# Patient Record
Sex: Male | Born: 1970 | Race: White | Hispanic: No | Marital: Married | Smoking: Never smoker
Health system: Southern US, Community
[De-identification: ages and names within clinical notes are randomized; demographics above are authoritative.]

## PROBLEM LIST (undated history)

## (undated) DIAGNOSIS — E785 Hyperlipidemia, unspecified: Secondary | ICD-10-CM

## (undated) DIAGNOSIS — F419 Anxiety disorder, unspecified: Secondary | ICD-10-CM

## (undated) HISTORY — PX: WISDOM TOOTH EXTRACTION: SHX21

## (undated) HISTORY — DX: Hyperlipidemia, unspecified: E78.5

## (undated) HISTORY — PX: HERNIA REPAIR: SHX51

## (undated) HISTORY — DX: Anxiety disorder, unspecified: F41.9

---

## 2005-10-20 ENCOUNTER — Emergency Department (HOSPITAL_COMMUNITY): Admission: EM | Admit: 2005-10-20 | Discharge: 2005-10-20 | Payer: Self-pay | Admitting: Emergency Medicine

## 2006-09-22 ENCOUNTER — Ambulatory Visit: Payer: Self-pay

## 2007-08-29 IMAGING — US ABDOMEN ULTRASOUND
1 series · 17 of 25 positions shown · non-contrast
Comparison: none

REASON FOR EXAM: Abdominal Pain
COMMENTS:

[Series 1: abdomen ultrasound · 17 of 57 slices shown]
[im 1/57]
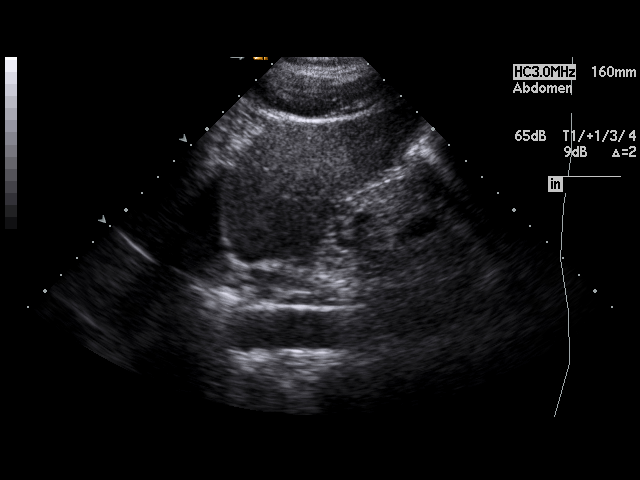
[im 5/57]
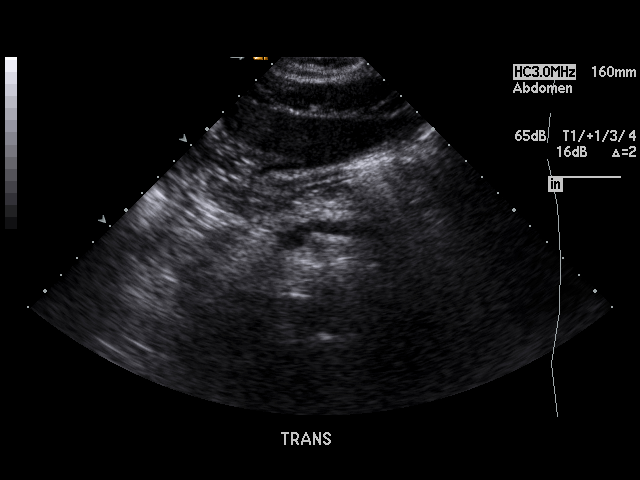
[im 8/57]
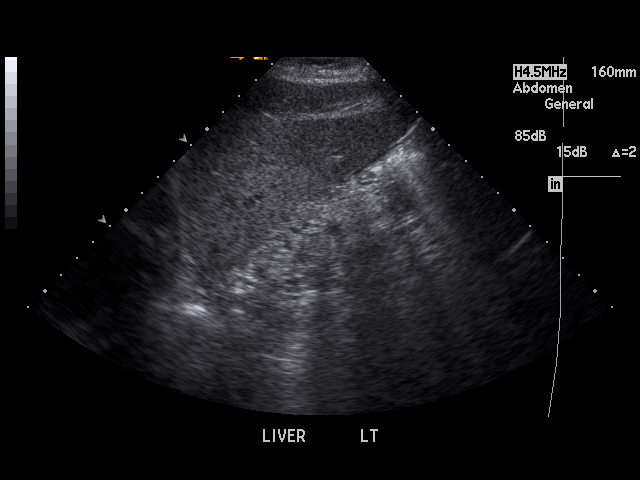
[im 12/57]
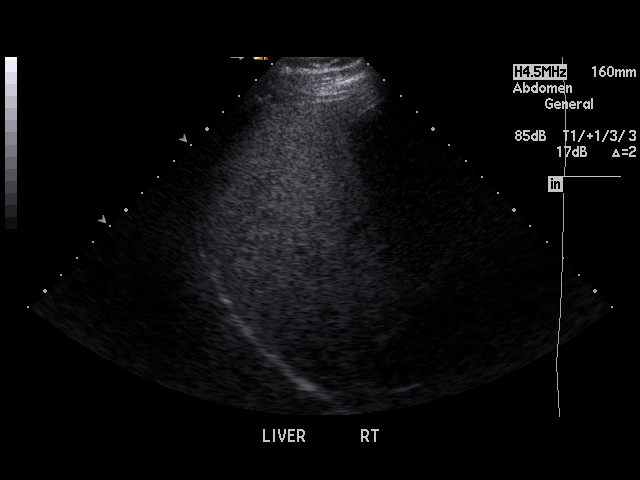
[im 15/57]
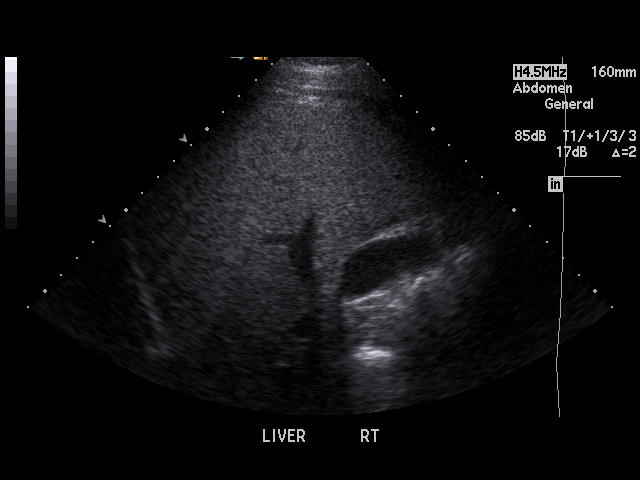
[im 19/57]
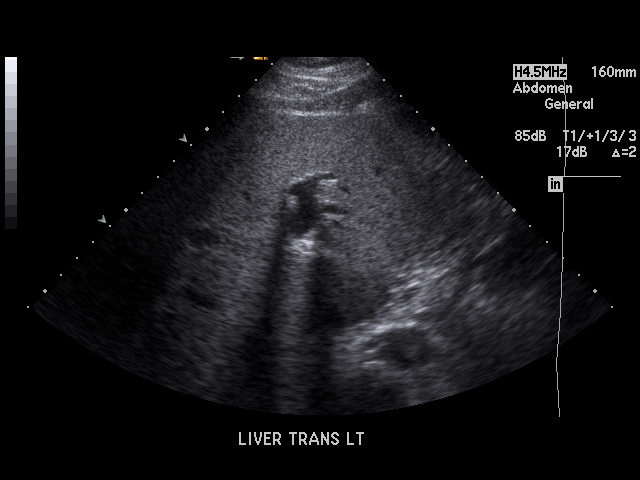
[im 22/57]
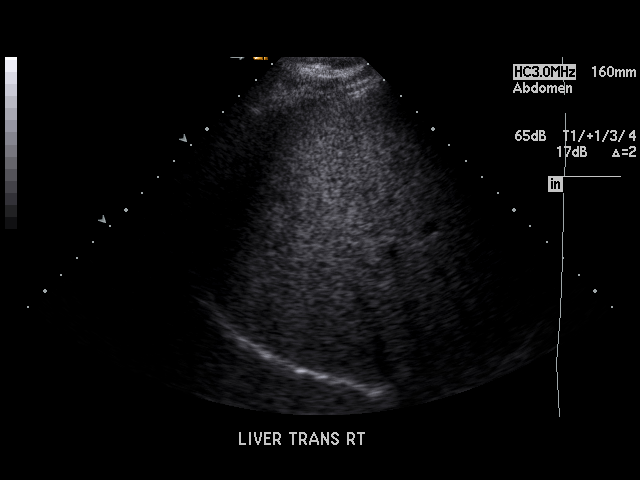
[im 26/57]
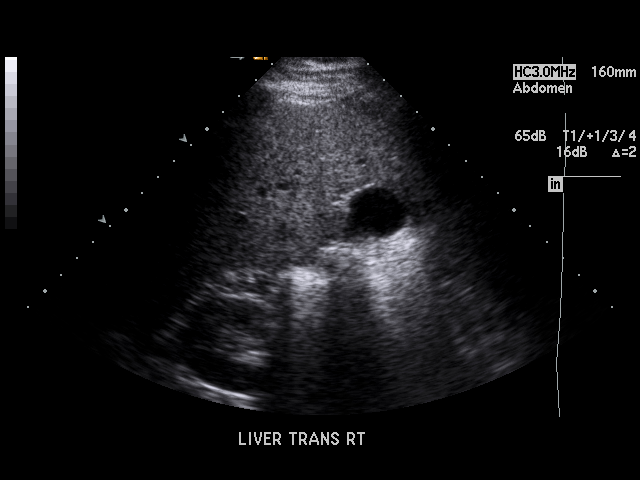
[im 29/57]
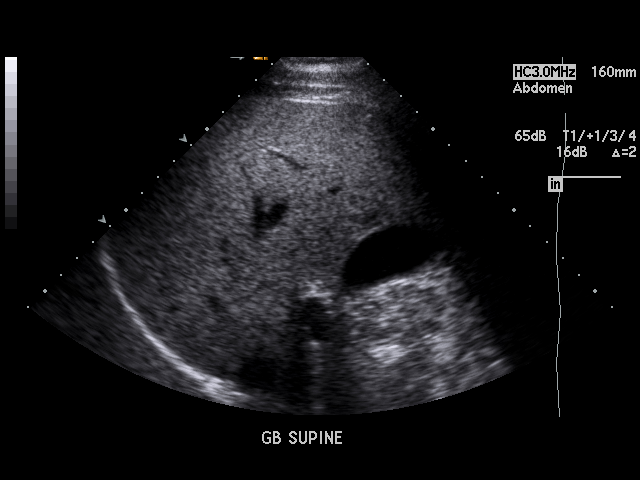
[im 31/57]
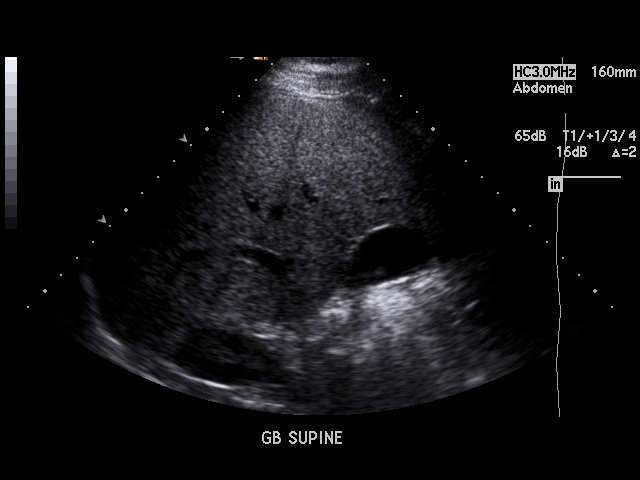
[im 36/57]
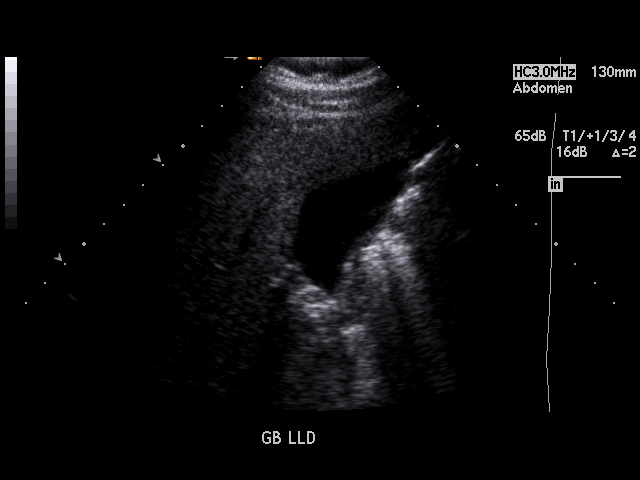
[im 38/57]
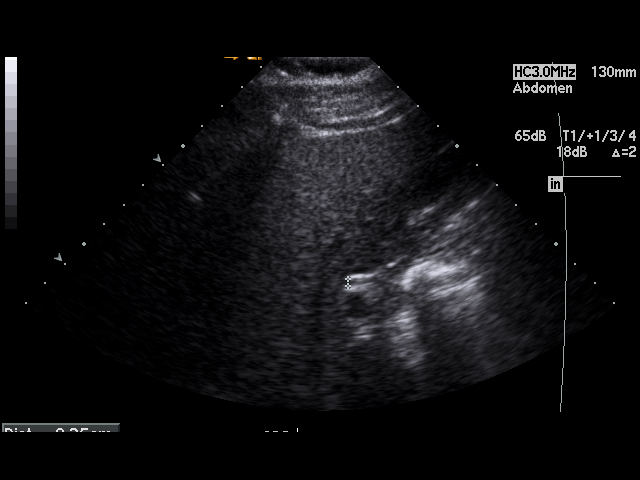
[im 43/57]
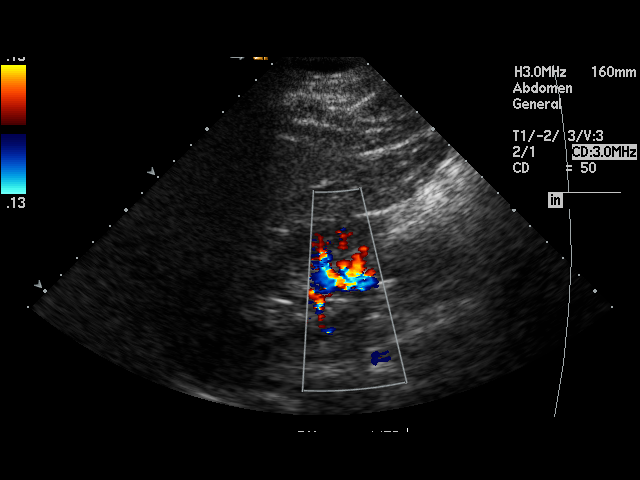
[im 45/57]
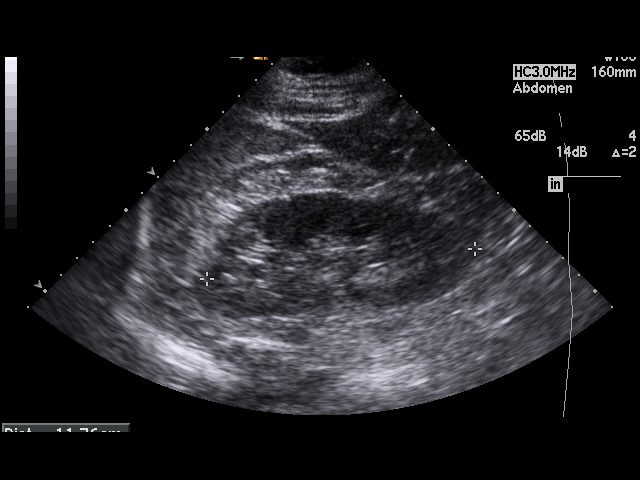
[im 50/57]
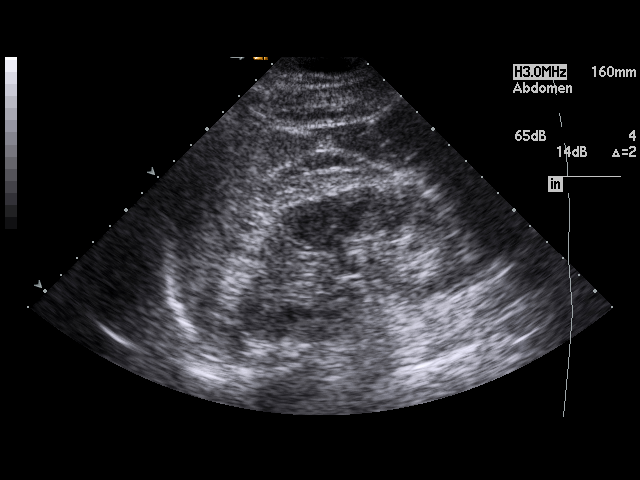
[im 52/57]
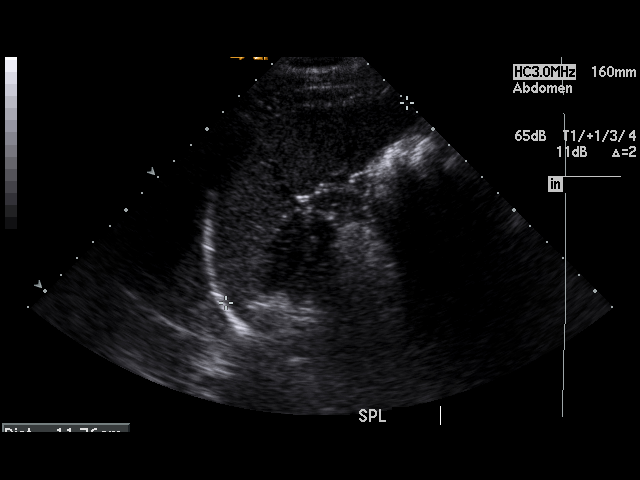
[im 57/57]
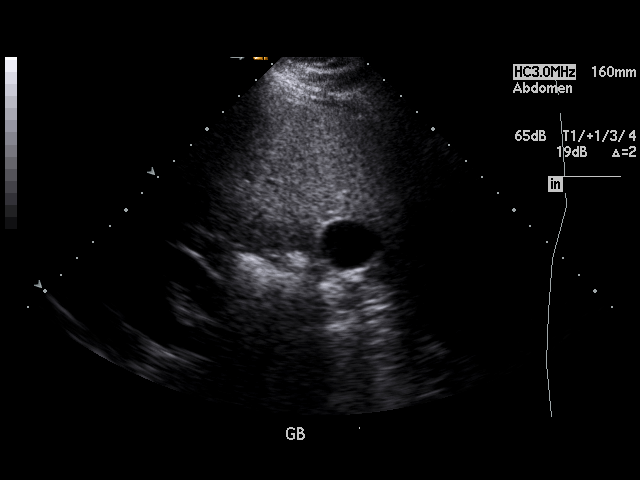

[17 of 25 positions shown; findings below may reference images not displayed]

PROCEDURE:     US  - US ABDOMEN GENERAL SURVEY  - September 22, 2006  [DATE]

RESULT:     The liver exhibits normal echotexture with no evidence of a mass
nor of ductal dilation. Portal venous flow is normal in direction toward the
liver. The pancreas is normal in contour and echotexture. There is no
evidence of ductal dilation within the pancreas or liver. The gallbladder is
adequately distended with no evidence of stones, wall thickening, or
pericholecystic fluid. The common bile duct is normal measuring 2.5 mm in
diameter. The spleen, abdominal aorta, and kidneys are normal in appearance.
There is no evidence of ascites.
IMPRESSION: 1.Normal abdominal ultrasound examination.

## 2008-11-28 ENCOUNTER — Ambulatory Visit: Payer: Self-pay | Admitting: Family Medicine

## 2009-11-04 IMAGING — CR DG KNEE 1-2V*R*
1 series · 2 of 2 positions shown · non-contrast
Comparison: none

REASON FOR EXAM: Right Knee Pain
COMMENTS:

PROCEDURE:     KDR - KDXR KNEE RIGHT AP AND LATERAL  - November 28, 2008  [DATE]
RESULT:     Images of the right knee demonstrate no fracture, dislocation or
radiopaque foreign body. Joint effusion may be present.

[Series 2: view not recorded · 0.17mm/px · 2 of 2 slices shown]
[im 1/2]
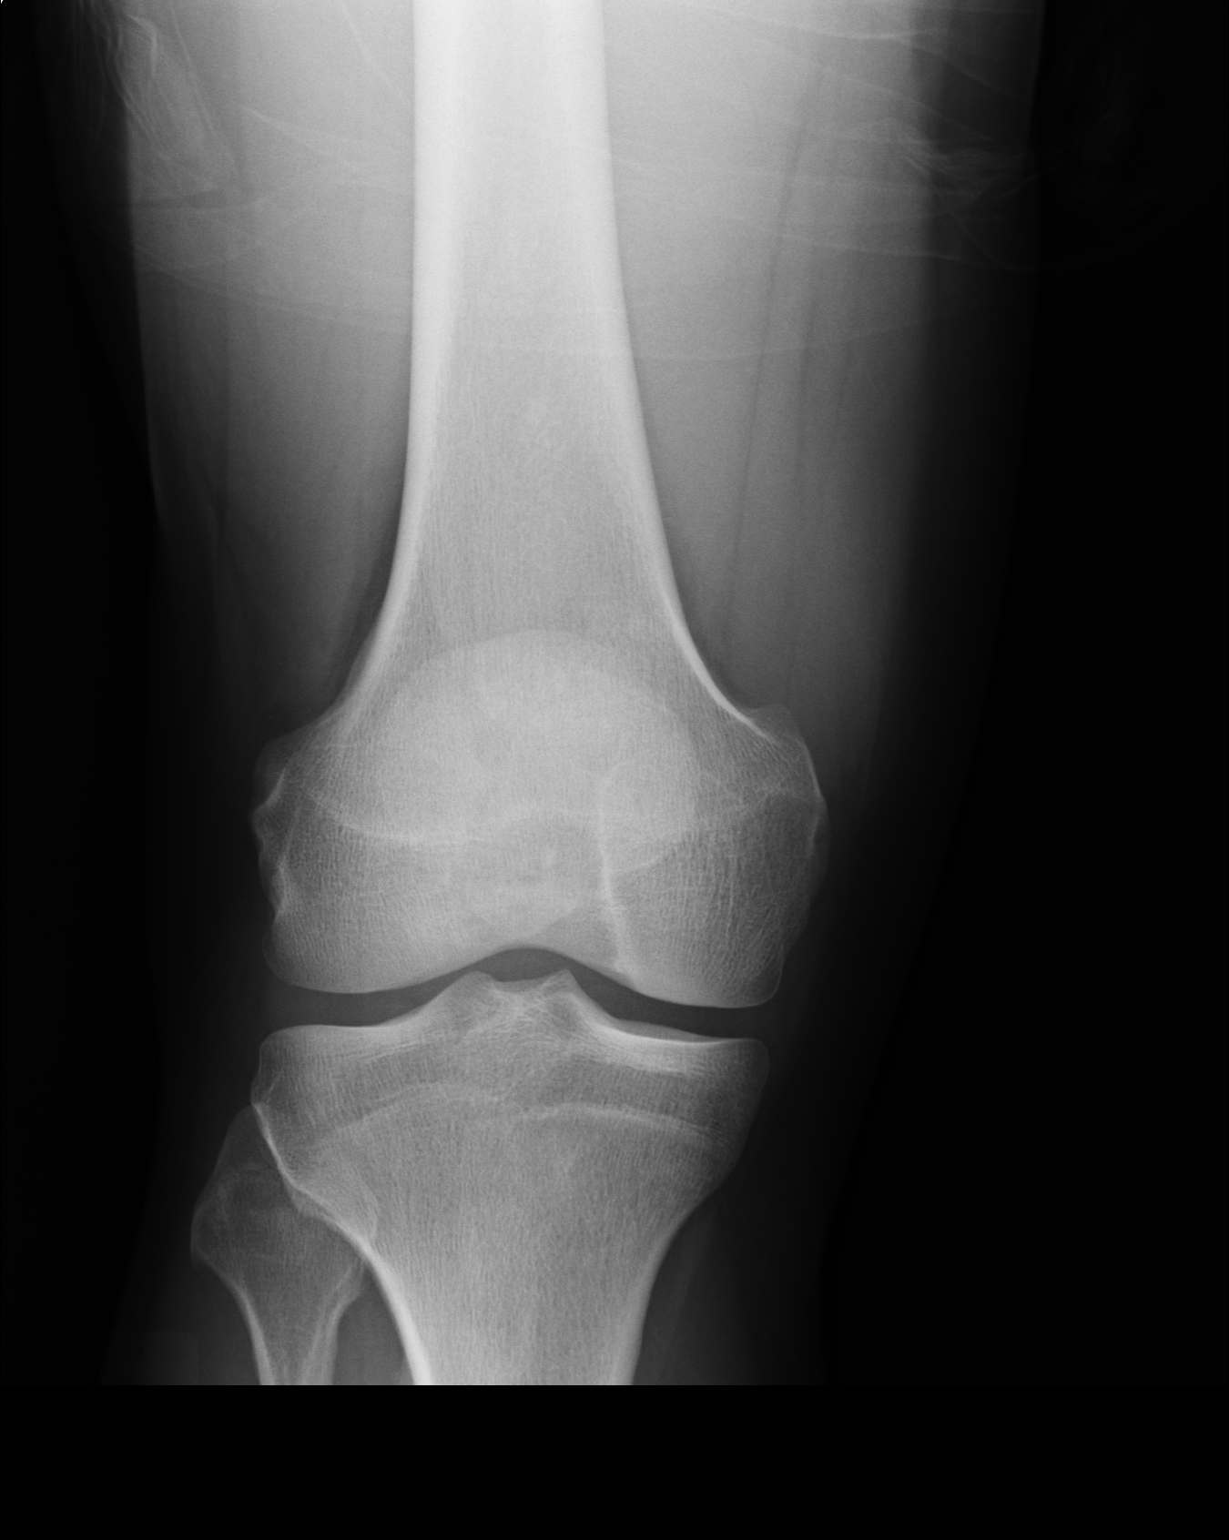
[im 2/2]
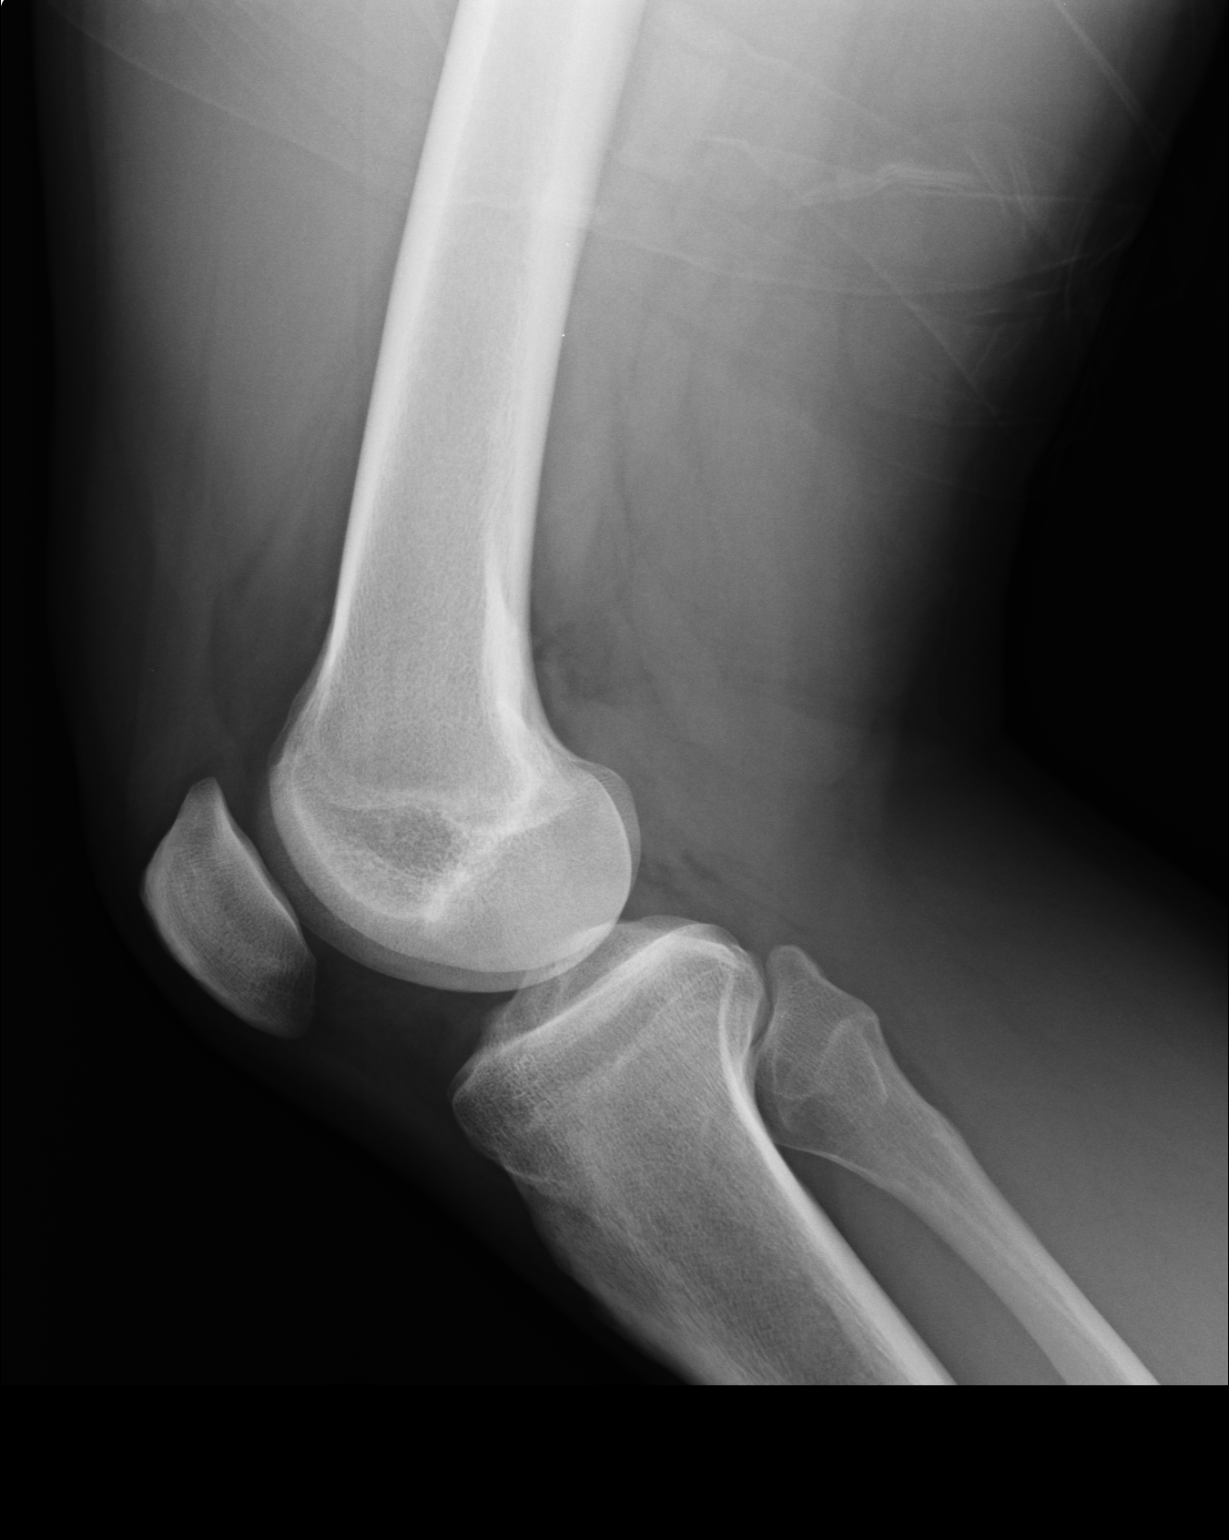

[2 of 2 positions shown; findings below may reference images not displayed]

IMPRESSION: No acute bony abnormality of the right knee demonstrated.

## 2013-02-25 LAB — BASIC METABOLIC PANEL
BUN: 15 mg/dL (ref 4–21)
Creatinine: 1.2 mg/dL (ref 0.6–1.3)
GLUCOSE: 85 mg/dL
POTASSIUM: 4.5 mmol/L (ref 3.4–5.3)
SODIUM: 141 mmol/L (ref 137–147)

## 2013-02-25 LAB — HEPATIC FUNCTION PANEL
ALT: 27 U/L (ref 10–40)
AST: 27 U/L (ref 14–40)
Alkaline Phosphatase: 72 U/L (ref 25–125)
Bilirubin, Total: 0.5 mg/dL

## 2013-02-25 LAB — LIPID PANEL
CHOLESTEROL: 190 mg/dL (ref 0–200)
HDL: 41 mg/dL (ref 35–70)
LDL CALC: 120 mg/dL
Triglycerides: 146 mg/dL (ref 40–160)

## 2016-02-01 DIAGNOSIS — K219 Gastro-esophageal reflux disease without esophagitis: Secondary | ICD-10-CM | POA: Insufficient documentation

## 2016-02-01 DIAGNOSIS — Z8619 Personal history of other infectious and parasitic diseases: Secondary | ICD-10-CM | POA: Insufficient documentation

## 2016-02-16 ENCOUNTER — Ambulatory Visit (INDEPENDENT_AMBULATORY_CARE_PROVIDER_SITE_OTHER): Payer: BLUE CROSS/BLUE SHIELD | Admitting: Family Medicine

## 2016-02-16 ENCOUNTER — Encounter: Payer: Self-pay | Admitting: Family Medicine

## 2016-02-16 VITALS — BP 130/82 | HR 66 | Temp 97.6°F | Resp 16 | Ht 68.0 in | Wt 176.2 lb

## 2016-02-16 DIAGNOSIS — Z0000 Encounter for general adult medical examination without abnormal findings: Secondary | ICD-10-CM | POA: Diagnosis not present

## 2016-02-16 NOTE — Progress Notes (Signed)
Subjective:     Patient ID: Samuel Tyler, male   DOB: 11/26/1970, 45 y.o.   MRN: [id number]  HPI  Chief Complaint  Patient presents with  . Annual Exam    Patient comes in office today for his annual physical, he states that he has no questions or concerns today. Patient reports that he is eating healthy and exercising 3x a week, patient is averaging between 7-8hrs of sleep at night. Last recorded Tdao was 02/25/13.   States he continues to work for [employer] in their [employer]. Reports he resumed running 2 miles every other day.   Review of Systems General: Feeling well HEENT: regular dental visits and recent eye exam. Wears glasses at work for viewing the computer screen. Cardiovascular: no chest pain, shortness of breath, or palpitations GI: no heartburn, no change in bowel habits or blood in the stool GU: nocturia x 0, no change in bladder habits  Psychiatric: not depressed Musculoskeletal: no joint pain; had pyriformis injury managed and resolved via his chiropractor Skin: Reports recent dermatology screen    Objective:   Physical Exam  Constitutional: He appears well-developed and well-nourished. No distress.  Eyes: PERRLA, Neck: no thyromegaly, tenderness or nodules, no cervical adenopathy ENT: TM's intact without inflammation; No tonsillar enlargement or exudate, Lungs: Clear Heart : RRR without murmur or gallop Abd: bowel sounds present, soft, non-tender, no organomegaly Extremities: no edema      Assessment:    1. Annual physical exam - Comprehensive metabolic panel - Lipid panel    Plan:    Further f/u pending lab results.

## 2016-02-16 NOTE — Patient Instructions (Signed)
We will call you with the lab results. 

## 2016-02-17 ENCOUNTER — Telehealth: Payer: Self-pay

## 2016-02-17 LAB — COMPREHENSIVE METABOLIC PANEL
ALBUMIN: 4.7 g/dL (ref 3.5–5.5)
ALK PHOS: 62 IU/L (ref 39–117)
ALT: 24 IU/L (ref 0–44)
AST: 22 IU/L (ref 0–40)
Albumin/Globulin Ratio: 1.5 (ref 1.2–2.2)
BUN / CREAT RATIO: 11 (ref 9–20)
BUN: 13 mg/dL (ref 6–24)
Bilirubin Total: 0.6 mg/dL (ref 0.0–1.2)
CO2: 27 mmol/L (ref 18–29)
CREATININE: 1.2 mg/dL (ref 0.76–1.27)
Calcium: 10.1 mg/dL (ref 8.7–10.2)
Chloride: 100 mmol/L (ref 96–106)
GFR calc Af Amer: 85 mL/min/{1.73_m2} (ref >59)
GFR calc non Af Amer: 73 mL/min/{1.73_m2} (ref >59)
GLUCOSE: 86 mg/dL (ref 65–99)
Globulin, Total: 3.2 g/dL (ref 1.5–4.5)
Potassium: 4.1 mmol/L (ref 3.5–5.2)
Sodium: 142 mmol/L (ref 134–144)
Total Protein: 7.9 g/dL (ref 6.0–8.5)

## 2016-02-17 LAB — LIPID PANEL
CHOLESTEROL TOTAL: 233 mg/dL — AB (ref 100–199)
Chol/HDL Ratio: 6.5 ratio units — ABNORMAL HIGH (ref 0.0–5.0)
HDL: 36 mg/dL — ABNORMAL LOW (ref >39)
LDL CALC: 149 mg/dL — AB (ref 0–99)
TRIGLYCERIDES: 242 mg/dL — AB (ref 0–149)
VLDL CHOLESTEROL CAL: 48 mg/dL — AB (ref 5–40)

## 2016-02-17 NOTE — Telephone Encounter (Signed)
Patient has been advised of lab report. KW 

## 2016-02-17 NOTE — Telephone Encounter (Signed)
-----   Message from Carmon Ginsberg, [place] sent at 02/17/2016  7:57 AM EDT ----- Your cholesterol has increased but your 10 year risk for developing cardiovascular disease remains low at 3.7%. Would encourage you to continue getting back into your running program. Would recheck your labs in one year.

## 2018-09-16 DIAGNOSIS — J101 Influenza due to other identified influenza virus with other respiratory manifestations: Secondary | ICD-10-CM | POA: Diagnosis not present

## 2019-02-15 ENCOUNTER — Other Ambulatory Visit: Payer: Self-pay

## 2019-02-15 DIAGNOSIS — R6889 Other general symptoms and signs: Secondary | ICD-10-CM | POA: Diagnosis not present

## 2019-02-15 DIAGNOSIS — Z20822 Contact with and (suspected) exposure to covid-19: Secondary | ICD-10-CM

## 2019-02-17 LAB — NOVEL CORONAVIRUS, NAA: SARS-CoV-2, NAA: NOT DETECTED

## 2019-06-19 ENCOUNTER — Telehealth: Payer: Self-pay

## 2019-06-19 NOTE — Telephone Encounter (Signed)
LMTCB, okay to schedule for a new patient 40 minute appointment slot.

## 2019-06-19 NOTE — Telephone Encounter (Signed)
Yes this is fine. I see his wife already.

## 2019-06-19 NOTE — Telephone Encounter (Signed)
Copied from [place] [phone]. Topic: Appointment Scheduling - New Patient >> Jun 18, 2019  4:24 PM Oneta Rack wrote: Patient would like to establish with Fenton Malling, PA. Spouse would like a follow up call today if possible (provider schedule is booking out out and not allowing me to schedule)

## 2019-06-28 NOTE — Progress Notes (Signed)
Patient: Samuel Mcburnie., Male    DOB: 1971-03-11, 48 y.o.   MRN: [id number] Visit Date: 07/01/2019  Today's Provider: Mar Daring, PA-C   Chief Complaint  Patient presents with  . Establish Care    with anew provider in office   Subjective:     Establishing care/Annual physical exam Samuel Tyler. is a 48 y.o. male who presents today for health maintenance and complete physical. He feels well. He reports exercising none. He reports he is sleeping well. -----------------------------------------------------------------   Review of Systems  Constitutional: Negative for fatigue and fever.  HENT: Negative.   Eyes: Negative for visual disturbance.  Respiratory: Negative for chest tightness, shortness of breath and wheezing.   Cardiovascular: Negative for chest pain, palpitations and leg swelling.  Gastrointestinal: Negative.   Endocrine: Negative.   Genitourinary: Negative.   Musculoskeletal: Negative.   Skin: Negative.   Allergic/Immunologic: Negative.   Neurological: Negative for weakness, light-headedness and headaches.  Hematological: Negative.   Psychiatric/Behavioral: Negative.     Social History      He  reports that he is a non-smoker but has been exposed to tobacco smoke. He has never used smokeless tobacco. He reports current alcohol use. He reports that he does not use drugs.       Social History   Socioeconomic History  . Marital status: Married    Spouse name: Not on file  . Number of children: Not on file  . Years of education: Not on file  . Highest education level: Not on file  Occupational History  . Not on file  Tobacco Use  . Smoking status: Passive Smoke Exposure - Never Smoker  . Smokeless tobacco: Never Used  Substance and Sexual Activity  . Alcohol use: Yes  . Drug use: No  . Sexual activity: Yes    Partners: Female    Birth control/protection: None  Other Topics Concern  . Not on file  Social History Narrative  .  Not on file   Social Determinants of Health   Financial Resource Strain:   . Difficulty of Paying Living Expenses: Not on file  Food Insecurity:   . Worried About Charity fundraiser in the Last Year: Not on file  . Ran Out of Food in the Last Year: Not on file  Transportation Needs:   . Lack of Transportation (Medical): Not on file  . Lack of Transportation (Non-Medical): Not on file  Physical Activity:   . Days of Exercise per Week: Not on file  . Minutes of Exercise per Session: Not on file  Stress:   . Feeling of Stress : Not on file  Social Connections:   . Frequency of Communication with Friends and Family: Not on file  . Frequency of Social Gatherings with Friends and Family: Not on file  . Attends Religious Services: Not on file  . Active Member of Clubs or Organizations: Not on file  . Attends Archivist Meetings: Not on file  . Marital Status: Not on file    History reviewed. No pertinent past medical history.   Patient Active Problem List   Diagnosis Date Noted  . History of chicken pox 02/01/2016    Past Surgical History:  Procedure Laterality Date  . HERNIA REPAIR    . WISDOM TOOTH EXTRACTION      Family History        Family Status  Relation Name Status  . Mother  Alive  .  Sister  Alive  . Brother  Alive  . Daughter  Alive  . Son  Alive  . Brother  Alive  . Daughter  Alive        His family history includes Hypertension in his mother.      No Known Allergies  No current outpatient medications on file.   Patient Care Team: Mar Daring, PA-C as PCP - General (Family Medicine)    Objective:    Vitals: BP 140/88 (BP Location: Left Arm, Patient Position: Sitting, Cuff Size: Large)   Pulse 78   Temp 97.6 F (36.4 C) (Temporal)   Resp 16   Ht 5\' 7"  (1.702 m)   Wt 194 lb (88 kg)   BMI 30.38 kg/m    Vitals:   07/01/19 0812  BP: 140/88  Pulse: 78  Resp: 16  Temp: 97.6 F (36.4 C)  TempSrc: Temporal  Weight: 194  lb (88 kg)  Height: 5\' 7"  (1.702 m)     Physical Exam Constitutional:      General: He is not in acute distress.    Appearance: Normal appearance. He is well-developed. He is obese. He is not ill-appearing.  HENT:     Head: Normocephalic and atraumatic.     Right Ear: Tympanic membrane, ear canal and external ear normal.     Left Ear: Tympanic membrane, ear canal and external ear normal.  Eyes:     General: No scleral icterus.       Right eye: No discharge.        Left eye: No discharge.     Extraocular Movements: Extraocular movements intact.     Conjunctiva/sclera: Conjunctivae normal.     Pupils: Pupils are equal, round, and reactive to light.  Neck:     Thyroid: No thyromegaly.     Trachea: No tracheal deviation.  Cardiovascular:     Rate and Rhythm: Normal rate and regular rhythm.     Pulses: Normal pulses.     Heart sounds: Normal heart sounds. No murmur.  Pulmonary:     Effort: Pulmonary effort is normal. No respiratory distress.     Breath sounds: Normal breath sounds. No wheezing or rales.  Chest:     Chest wall: No tenderness.  Abdominal:     General: Abdomen is flat. Bowel sounds are normal. There is no distension.     Palpations: Abdomen is soft. There is no mass.     Tenderness: There is no abdominal tenderness. There is no guarding or rebound.     Hernia: No hernia is present.  Musculoskeletal:        General: No tenderness. Normal range of motion.     Cervical back: Normal range of motion and neck supple.     Right lower leg: No edema.     Left lower leg: No edema.  Lymphadenopathy:     Cervical: No cervical adenopathy.  Skin:    General: Skin is warm and dry.     Capillary Refill: Capillary refill takes less than 2 seconds.     Findings: No erythema or rash.  Neurological:     General: No focal deficit present.     Mental Status: He is alert and oriented to person, place, and time. Mental status is at baseline.     Cranial Nerves: No cranial nerve  deficit.     Motor: No abnormal muscle tone.     Coordination: Coordination normal.     Deep Tendon Reflexes: Reflexes are normal  and symmetric. Reflexes normal.  Psychiatric:        Mood and Affect: Mood normal.        Behavior: Behavior normal.        Thought Content: Thought content normal.        Judgment: Judgment normal.      Depression Screen PHQ 2/9 Scores 07/01/2019 02/16/2016  PHQ - 2 Score 0 0       Assessment & Plan:     Routine Health Maintenance and Physical Exam  Exercise Activities and Dietary recommendations Goals   None     Immunization History  Administered Date(s) Administered  . Influenza Inj Mdck Quad Pf 05/03/2017  . Influenza, Seasonal, Injecte, Preservative Fre 05/04/2016  . Influenza,inj,Quad PF,6+ Mos 04/26/2018  . Influenza,inj,quad, With Preservative 04/28/2015  . Influenza-Unspecified 04/29/2014  . Tdap 02/25/2013    Health Maintenance  Topic Date Due  . HIV Screening  10/27/1986  . INFLUENZA VACCINE  10/16/2019 (Originally 02/16/2019)  . TETANUS/TDAP  02/26/2023     Discussed health benefits of physical activity, and encouraged him to engage in regular exercise appropriate for his age and condition.    1. Annual physical exam Normal physical exam today. Will check labs as below and f/u pending lab results. If labs are stable and WNL he will not need to have these rechecked for one year at his next annual physical exam. He is to call the office in the meantime if he has any acute issue, questions or concerns. - CBC w/Diff - Comprehensive Metabolic Panel (CMET) - HgB A1c  2. Colon cancer screening Deferred at this time.   3. Prostate cancer screening Will check labs as below and f/u pending results. - PSA  4. Class 1 obesity due to excess calories with serious comorbidity and body mass index (BMI) of 30.0 to 30.9 in adult Counseled patient on healthy lifestyle modifications including dieting and exercise.   5. Screening for  diabetes mellitus Will check labs as below and f/u pending results. - HgB A1c  6. Encounter for lipid screening for cardiovascular disease Will check labs as below and f/u pending results. - Lipid Profile  7. Thyroid disorder screen Will check labs as below and f/u pending results. - TSH  8. Screening for HIV without presence of risk factors Will check labs as below and f/u pending results. - HIV antibody (with reflex)  --------------------------------------------------------------------    Mar Daring, PA-C  [facility]

## 2019-07-01 ENCOUNTER — Encounter: Payer: Self-pay | Admitting: Physician Assistant

## 2019-07-01 ENCOUNTER — Ambulatory Visit (INDEPENDENT_AMBULATORY_CARE_PROVIDER_SITE_OTHER): Payer: BC Managed Care – PPO | Admitting: Physician Assistant

## 2019-07-01 VITALS — BP 140/88 | HR 78 | Temp 97.6°F | Resp 16 | Ht 67.0 in | Wt 194.0 lb

## 2019-07-01 DIAGNOSIS — Z1329 Encounter for screening for other suspected endocrine disorder: Secondary | ICD-10-CM

## 2019-07-01 DIAGNOSIS — Z6830 Body mass index (BMI) 30.0-30.9, adult: Secondary | ICD-10-CM

## 2019-07-01 DIAGNOSIS — E6609 Other obesity due to excess calories: Secondary | ICD-10-CM

## 2019-07-01 DIAGNOSIS — Z125 Encounter for screening for malignant neoplasm of prostate: Secondary | ICD-10-CM

## 2019-07-01 DIAGNOSIS — Z0000 Encounter for general adult medical examination without abnormal findings: Secondary | ICD-10-CM

## 2019-07-01 DIAGNOSIS — Z131 Encounter for screening for diabetes mellitus: Secondary | ICD-10-CM | POA: Diagnosis not present

## 2019-07-01 DIAGNOSIS — Z1211 Encounter for screening for malignant neoplasm of colon: Secondary | ICD-10-CM | POA: Diagnosis not present

## 2019-07-01 DIAGNOSIS — Z13220 Encounter for screening for lipoid disorders: Secondary | ICD-10-CM | POA: Diagnosis not present

## 2019-07-01 DIAGNOSIS — Z136 Encounter for screening for cardiovascular disorders: Secondary | ICD-10-CM

## 2019-07-01 DIAGNOSIS — Z114 Encounter for screening for human immunodeficiency virus [HIV]: Secondary | ICD-10-CM

## 2019-07-01 NOTE — Patient Instructions (Signed)

## 2019-07-02 LAB — CBC WITH DIFFERENTIAL/PLATELET
Basophils Absolute: 0.1 10*3/uL (ref 0.0–0.2)
Basos: 1 %
EOS (ABSOLUTE): 0.2 10*3/uL (ref 0.0–0.4)
Eos: 3 %
Hematocrit: 47.1 % (ref 37.5–51.0)
Hemoglobin: 16.1 g/dL (ref 13.0–17.7)
Immature Grans (Abs): 0 10*3/uL (ref 0.0–0.1)
Immature Granulocytes: 1 %
Lymphocytes Absolute: 2.2 10*3/uL (ref 0.7–3.1)
Lymphs: 35 %
MCH: 29.8 pg (ref 26.6–33.0)
MCHC: 34.2 g/dL (ref 31.5–35.7)
MCV: 87 fL (ref 79–97)
Monocytes Absolute: 0.6 10*3/uL (ref 0.1–0.9)
Monocytes: 9 %
Neutrophils Absolute: 3.4 10*3/uL (ref 1.4–7.0)
Neutrophils: 51 %
Platelets: 192 10*3/uL (ref 150–450)
RBC: 5.41 x10E6/uL (ref 4.14–5.80)
RDW: 13.1 % (ref 11.6–15.4)
WBC: 6.4 10*3/uL (ref 3.4–10.8)

## 2019-07-02 LAB — LIPID PANEL
Chol/HDL Ratio: 7.8 ratio — ABNORMAL HIGH (ref 0.0–5.0)
Cholesterol, Total: 234 mg/dL — ABNORMAL HIGH (ref 100–199)
HDL: 30 mg/dL — ABNORMAL LOW (ref >39)
LDL Chol Calc (NIH): 139 mg/dL — ABNORMAL HIGH (ref 0–99)
Triglycerides: 356 mg/dL — ABNORMAL HIGH (ref 0–149)
VLDL Cholesterol Cal: 65 mg/dL — ABNORMAL HIGH (ref 5–40)

## 2019-07-02 LAB — COMPREHENSIVE METABOLIC PANEL
ALT: 31 IU/L (ref 0–44)
AST: 22 IU/L (ref 0–40)
Albumin/Globulin Ratio: 1.8 (ref 1.2–2.2)
Albumin: 4.8 g/dL (ref 4.0–5.0)
Alkaline Phosphatase: 76 IU/L (ref 39–117)
BUN/Creatinine Ratio: 11 (ref 9–20)
BUN: 13 mg/dL (ref 6–24)
Bilirubin Total: 0.4 mg/dL (ref 0.0–1.2)
CO2: 22 mmol/L (ref 20–29)
Calcium: 10 mg/dL (ref 8.7–10.2)
Chloride: 101 mmol/L (ref 96–106)
Creatinine, Ser: 1.18 mg/dL (ref 0.76–1.27)
GFR calc Af Amer: 84 mL/min/{1.73_m2} (ref >59)
GFR calc non Af Amer: 73 mL/min/{1.73_m2} (ref >59)
Globulin, Total: 2.6 g/dL (ref 1.5–4.5)
Glucose: 90 mg/dL (ref 65–99)
Potassium: 4 mmol/L (ref 3.5–5.2)
Sodium: 141 mmol/L (ref 134–144)
Total Protein: 7.4 g/dL (ref 6.0–8.5)

## 2019-07-02 LAB — TSH: TSH: 4.75 u[IU]/mL — ABNORMAL HIGH (ref 0.450–4.500)

## 2019-07-02 LAB — PSA: Prostate Specific Ag, Serum: 2 ng/mL (ref 0.0–4.0)

## 2019-07-02 LAB — HIV ANTIBODY (ROUTINE TESTING W REFLEX): HIV Screen 4th Generation wRfx: NONREACTIVE

## 2019-07-02 LAB — HEMOGLOBIN A1C
Est. average glucose Bld gHb Est-mCnc: 105 mg/dL
Hgb A1c MFr Bld: 5.3 % (ref 4.8–5.6)

## 2019-08-09 ENCOUNTER — Other Ambulatory Visit: Payer: BC Managed Care – PPO

## 2019-08-09 ENCOUNTER — Ambulatory Visit: Payer: BC Managed Care – PPO | Attending: Internal Medicine

## 2019-08-09 DIAGNOSIS — U071 COVID-19: Secondary | ICD-10-CM | POA: Insufficient documentation

## 2019-08-09 DIAGNOSIS — Z20822 Contact with and (suspected) exposure to covid-19: Secondary | ICD-10-CM

## 2019-08-10 LAB — NOVEL CORONAVIRUS, NAA: SARS-CoV-2, NAA: DETECTED — AB

## 2019-08-19 ENCOUNTER — Encounter: Payer: Self-pay | Admitting: Physician Assistant

## 2020-02-06 ENCOUNTER — Encounter: Payer: Self-pay | Admitting: Physician Assistant

## 2020-02-06 ENCOUNTER — Other Ambulatory Visit: Payer: Self-pay

## 2020-02-06 ENCOUNTER — Ambulatory Visit (INDEPENDENT_AMBULATORY_CARE_PROVIDER_SITE_OTHER): Payer: BC Managed Care – PPO | Admitting: Physician Assistant

## 2020-02-06 VITALS — BP 144/91 | HR 80 | Temp 97.3°F | Resp 16 | Wt 197.0 lb

## 2020-02-06 DIAGNOSIS — D171 Benign lipomatous neoplasm of skin and subcutaneous tissue of trunk: Secondary | ICD-10-CM | POA: Diagnosis not present

## 2020-02-06 NOTE — Assessment & Plan Note (Addendum)
Possible Lipoma/cyst. [place] order as below to make sure no concerning features/rule out malignancy. Monitor for any changes in shape, size, consistency, or overlying skin

## 2020-02-06 NOTE — Patient Instructions (Signed)
Lipoma  A lipoma is a noncancerous (benign) tumor that is made up of fat cells. This is a very common type of soft-tissue growth. Lipomas are usually found under the skin (subcutaneous). They may occur in any tissue of the body that contains fat. Common areas for lipomas to appear include the back, arms, shoulders, buttocks, and thighs. Lipomas grow slowly, and they are usually painless. Most lipomas do not cause problems and do not require treatment. What are the causes? The cause of this condition is not known. What increases the risk? You are more likely to develop this condition if:  You are 40-60 years old.  You have a family history of lipomas. What are the signs or symptoms? A lipoma usually appears as a small, round bump under the skin. In most cases, the lump will:  Feel soft or rubbery.  Not cause pain or other symptoms. However, if a lipoma is located in an area where it pushes on nerves, it can become painful or cause other symptoms. How is this diagnosed? A lipoma can usually be diagnosed with a physical exam. You may also have tests to confirm the diagnosis and to rule out other conditions. Tests may include:  Imaging tests, such as a CT scan or an MRI.  Removal of a tissue sample to be looked at under a microscope (biopsy). How is this treated? Treatment for this condition depends on the size of the lipoma and whether it is causing any symptoms.  For small lipomas that are not causing problems, no treatment is needed.  If a lipoma is bigger or it causes problems, surgery may be done to remove the lipoma. Lipomas can also be removed to improve appearance. Most often, the procedure is done after applying a medicine that numbs the area (local anesthetic).  Liposuction may be done to reduce the size of the lipoma before it is removed through surgery, or it may be done to remove the lipoma. Lipomas are removed with this method in order to limit incision size and scarring. A  liposuction tube is inserted through a small incision into the lipoma, and the contents of the lipoma are removed through the tube with suction. Follow these instructions at home:  Watch your lipoma for any changes.  Keep all follow-up visits as told by your health care provider. This is important. Contact a health care provider if:  Your lipoma becomes larger or hard.  Your lipoma becomes painful, red, or increasingly swollen. These could be signs of infection or a more serious condition. Get help right away if:  You develop tingling or numbness in an area near the lipoma. This could indicate that the lipoma is causing nerve damage. Summary  A lipoma is a noncancerous tumor that is made up of fat cells.  Most lipomas do not cause problems and do not require treatment.  If a lipoma is bigger or it causes problems, surgery may be done to remove the lipoma.  Contact a health care provider if your lipoma becomes larger or hard, or if it becomes painful, red, or increasingly swollen. Pain, redness, and swelling could be signs of infection or a more serious condition. This information is not intended to replace advice given to you by your health care provider. Make sure you discuss any questions you have with your health care provider. Document Revised: 02/18/2019 Document Reviewed: 02/18/2019 Elsevier Patient Education  2020 [employer]vier Inc.  

## 2020-02-06 NOTE — Progress Notes (Signed)
I,Joseline E Rosas,acting as a scribe for [employer], PA-C.,have documented all relevant documentation on the behalf of Mar Daring, PA-C,as directed by  Mar Daring, PA-C while in the presence of Mar Daring, [place].  Established patient visit   Patient: Samuel Tyler.   DOB: 1971-07-12   49 y.o. Male  MRN: [id number] Visit Date: 02/06/2020  Today's healthcare provider: Mar Daring, PA-C   Chief Complaint  Patient presents with  . Mass   Subjective    HPI  Patient here with c/o small lump/knot on the right side of his lower back, right under rib cage area. No redness, pain,tenderness, or discharge.   Patient Active Problem List   Diagnosis Date Noted  . 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 History of chicken pox 02/01/2016   History reviewed. No pertinent past medical history.     Medications: No outpatient medications prior to visit.   No facility-administered medications prior to visit.    Review of Systems  Respiratory: Negative for chest tightness, shortness of breath and wheezing.   Cardiovascular: Negative for chest pain and palpitations.    Last CBC Lab Results  Component Value Date   WBC 6.4 07/01/2019   HGB 16.1 07/01/2019   HCT 47.1 07/01/2019   MCV 87 07/01/2019   MCH 29.8 07/01/2019   RDW 13.1 07/01/2019   PLT 192 99/35/7017   Last metabolic panel Lab Results  Component Value Date   GLUCOSE 90 07/01/2019   NA 141 07/01/2019   K 4.0 07/01/2019   CL 101 07/01/2019   CO2 22 07/01/2019   BUN 13 07/01/2019   CREATININE 1.18 07/01/2019   GFRNONAA 73 07/01/2019   GFRAA 84 07/01/2019   CALCIUM 10.0 07/01/2019   PROT 7.4 07/01/2019   ALBUMIN 4.8 07/01/2019   LABGLOB 2.6 07/01/2019   AGRATIO 1.8 07/01/2019   BILITOT 0.4 07/01/2019   ALKPHOS 76 07/01/2019   AST 22 07/01/2019   ALT 31 07/01/2019      Objective    BP (!) 144/91 (BP Location: Left Arm, Patient Position: Sitting, Cuff Size: Large)   Pulse  80   Temp (!) 97.3 F (36.3 C) (Temporal)   Resp 16   Wt 197 lb (89.4 kg)   BMI 30.85 kg/m  BP Readings from Last 3 Encounters:  02/06/20 (!) 144/91  07/01/19 140/88  02/16/16 130/82   Wt Readings from Last 3 Encounters:  02/06/20 197 lb (89.4 kg)  07/01/19 194 lb (88 kg)  02/16/16 176 lb 3.2 oz (79.9 kg)      Physical Exam Vitals reviewed.  Constitutional:      General: He is not in acute distress.    Appearance: Normal appearance. He is obese. He is not ill-appearing.  HENT:     Head: Normocephalic and atraumatic.  Eyes:     General: No scleral icterus. Pulmonary:     Effort: No respiratory distress.  Musculoskeletal:     Cervical back: Normal range of motion and neck supple.     Comments:   small (2cm x 1cm) well circumscribed, slightly mobile, non-tender lesion on right flank   Skin:      Neurological:     Mental Status: He is alert.       No results found for any visits on 02/06/20.  Assessment & Plan     Problem List Items Addressed This Visit      Other   Lipoma of back - Primary  Possible Lipoma/cyst. [place] order as below.        Relevant Orders   [place] CHEST SOFT TISSUE      No follow-ups on file.      Reynolds Bowl, PA-C, have reviewed all documentation for this visit. The documentation on 02/06/20 for the exam, diagnosis, procedures, and orders are all accurate and complete.   Rubye [place]  [facility] [phone] (phone) [phone] (fax)  [place]

## 2020-02-14 ENCOUNTER — Ambulatory Visit: Payer: Self-pay

## 2020-02-25 ENCOUNTER — Other Ambulatory Visit: Payer: Self-pay

## 2020-02-25 ENCOUNTER — Ambulatory Visit
Admission: RE | Admit: 2020-02-25 | Discharge: 2020-02-25 | Disposition: A | Discharge status: Home / Self Care | Payer: BC Managed Care – PPO | Source: Ambulatory Visit | Attending: Physician Assistant | Admitting: Physician Assistant

## 2020-02-25 DIAGNOSIS — D1779 Benign lipomatous neoplasm of other sites: Secondary | ICD-10-CM | POA: Diagnosis not present

## 2020-02-25 DIAGNOSIS — D171 Benign lipomatous neoplasm of skin and subcutaneous tissue of trunk: Secondary | ICD-10-CM | POA: Insufficient documentation

## 2020-02-25 DIAGNOSIS — R1900 Intra-abdominal and pelvic swelling, mass and lump, unspecified site: Secondary | ICD-10-CM | POA: Diagnosis not present

## 2020-06-19 ENCOUNTER — Encounter: Payer: Self-pay | Admitting: Physician Assistant

## 2020-06-19 DIAGNOSIS — Z6830 Body mass index (BMI) 30.0-30.9, adult: Secondary | ICD-10-CM

## 2020-06-19 DIAGNOSIS — Z8616 Personal history of COVID-19: Secondary | ICD-10-CM

## 2020-06-19 DIAGNOSIS — Z125 Encounter for screening for malignant neoplasm of prostate: Secondary | ICD-10-CM

## 2020-06-23 NOTE — Progress Notes (Signed)
Complete physical exam   Patient: Samuel Tyler.   DOB: 1971/02/11   49 y.o. Male  MRN: [id number] Visit Date: 06/24/2020  Today's healthcare provider: Mar Daring, PA-C   Chief Complaint  Patient presents with   Annual Exam   Subjective    Samuel Tyler. is a 49 y.o. male who presents today for a complete physical exam.  He reports consuming a general diet. The patient does not participate in regular exercise at present. He generally feels well. He reports sleeping fairly well. He does not have additional problems to discuss today.  HPI  He received his flu vaccine at [place] last month. Covid vaccines also given at [facility]. Tdap given this year at his employer.  History reviewed. No pertinent past medical history. Past Surgical History:  Procedure Laterality Date   HERNIA REPAIR     WISDOM TOOTH EXTRACTION     Social History   Socioeconomic History   Marital status: Married    Spouse name: Not on file   Number of children: Not on file   Years of education: Not on file   Highest education level: Not on file  Occupational History   Not on file  Tobacco Use   Smoking status: Passive Smoke Exposure - Never Smoker   Smokeless tobacco: Never Used  Substance and Sexual Activity   Alcohol use: Yes   Drug use: No   Sexual activity: Yes    Partners: Female    Birth control/protection: None  Other Topics Concern   Not on file  Social History Narrative   Not on file   Social Determinants of Health   Financial Resource Strain:    Difficulty of Paying Living Expenses: Not on file  Food Insecurity:    Worried About Charity fundraiser in the Last Year: Not on file   [employer] of Food in the Last Year: Not on file  Transportation Needs:    Lack of Transportation (Medical): Not on file   Lack of Transportation (Non-Medical): Not on file  Physical Activity:    Days of Exercise per Week: Not on file   Minutes of  Exercise per Session: Not on file  Stress:    Feeling of Stress : Not on file  Social Connections:    Frequency of Communication with Friends and Family: Not on file   Frequency of Social Gatherings with Friends and Family: Not on file   Attends Religious Services: Not on file   Active Member of Clubs or Organizations: Not on file   Attends Archivist Meetings: Not on file   Marital Status: Not on file  Intimate Partner Violence:    Fear of Current or Ex-Partner: Not on file   Emotionally Abused: Not on file   Physically Abused: Not on file   Sexually Abused: Not on file   Family Status  Relation Name Status   Mother  Alive   Sister  Alive   Brother  Alive   Daughter  Alive   Son  Alive   Brother  Alive   Daughter  Alive   Family History  Problem Relation Age of Onset   Hypertension Mother    No Known Allergies  Patient Care Team: [place], Jeralyn Bennett as PCP - General (Family Medicine)   Medications: No outpatient medications prior to visit.   No facility-administered medications prior to visit.    Review of Systems  Constitutional: Negative.  HENT: Negative.   Eyes: Negative.   Respiratory: Negative.   Cardiovascular: Negative.   Gastrointestinal: Negative.   Endocrine: Positive for heat intolerance.  Genitourinary: Negative.   Musculoskeletal: Negative.   Skin: Negative.   Allergic/Immunologic: Negative.   Neurological: Negative.   Hematological: Negative.   Psychiatric/Behavioral: Negative.        Objective    BP (!) 148/92 (BP Location: Left Arm, Patient Position: Sitting, Cuff Size: Normal)    Pulse 76    Temp 98.4 F (36.9 C) (Oral)    Resp 16    Ht 5\' 7"  (1.702 m)    Wt 193 lb 8 oz (87.8 kg)    BMI 30.31 kg/m     Physical Exam Vitals reviewed.  Constitutional:      General: He is not in acute distress.    Appearance: Normal appearance. He is well-developed. He is obese. He is not ill-appearing.  HENT:      Head: Normocephalic and atraumatic.     Right Ear: Tympanic membrane, ear canal and external ear normal.     Left Ear: Tympanic membrane, ear canal and external ear normal.  Eyes:     General: No scleral icterus.       Right eye: No discharge.        Left eye: No discharge.     Extraocular Movements: Extraocular movements intact.     Conjunctiva/sclera: Conjunctivae normal.     Pupils: Pupils are equal, round, and reactive to light.  Neck:     Thyroid: No thyromegaly.     Trachea: No tracheal deviation.  Cardiovascular:     Rate and Rhythm: Normal rate and regular rhythm.     Pulses: Normal pulses.     Heart sounds: Normal heart sounds. No murmur heard.   Pulmonary:     Effort: Pulmonary effort is normal. No respiratory distress.     Breath sounds: Normal breath sounds. No wheezing or rales.  Chest:     Chest wall: No tenderness.  Abdominal:     General: Abdomen is flat. Bowel sounds are normal. There is no distension.     Palpations: Abdomen is soft. There is no mass.     Tenderness: There is no abdominal tenderness. There is no guarding or rebound.  Musculoskeletal:        General: No tenderness. Normal range of motion.     Cervical back: Normal range of motion and neck supple. No tenderness.     Right lower leg: No edema.     Left lower leg: No edema.  Lymphadenopathy:     Cervical: No cervical adenopathy.  Skin:    General: Skin is warm and dry.     Capillary Refill: Capillary refill takes less than 2 seconds.     Findings: No erythema or rash.  Neurological:     General: No focal deficit present.     Mental Status: He is alert and oriented to person, place, and time. Mental status is at baseline.     Cranial Nerves: No cranial nerve deficit.     Motor: No abnormal muscle tone.     Coordination: Coordination normal.     Deep Tendon Reflexes: Reflexes are normal and symmetric. Reflexes normal.  Psychiatric:        Mood and Affect: Mood normal.        Behavior:  Behavior normal.        Thought Content: Thought content normal.        Judgment: Judgment  normal.     Last depression screening scores PHQ 2/9 Scores 06/24/2020 07/01/2019 02/16/2016  PHQ - 2 Score 0 0 0   Last fall risk screening Fall Risk  06/24/2020  Falls in the past year? 0  Number falls in past yr: 0  Injury with Fall? 0  Risk for fall due to : No Fall Risks  Follow up Falls evaluation completed   Last Audit-C alcohol use screening Alcohol Use Disorder Test (AUDIT) 06/24/2020  1. How often do you have a drink containing alcohol? 3  2. How many drinks containing alcohol do you have on a typical day when you are drinking? 0  3. How often do you have six or more drinks on one occasion? 0  AUDIT-C Score 3  Alcohol Brief Interventions/Follow-up AUDIT Score <7 follow-up not indicated   A score of 3 or more in women, and 4 or more in men indicates increased risk for alcohol abuse, EXCEPT if all of the points are from question 1   No results found for any visits on 06/24/20.  Assessment & Plan    Routine Health Maintenance and Physical Exam  Exercise Activities and Dietary recommendations Goals   None     Immunization History  Administered Date(s) Administered   Influenza Inj Mdck Quad Pf 05/03/2017   Influenza, Seasonal, Injecte, Preservative Fre 05/04/2016   Influenza,inj,Quad PF,6+ Mos 04/26/2018   Influenza,inj,quad, With Preservative 04/28/2015   Influenza-Unspecified 04/29/2014   Tdap 02/25/2013    Health Maintenance  Topic Date Due   Hepatitis C Screening  Never done   COVID-19 Vaccine (1) Never done   INFLUENZA VACCINE  02/16/2020   TETANUS/TDAP  02/26/2023   HIV Screening  Completed    Discussed health benefits of physical activity, and encouraged him to engage in regular exercise appropriate for his age and condition.  1. Annual physical exam Normal physical exam today. Will check labs as below and f/u pending lab results. If labs are stable  and WNL he will not need to have these rechecked for one year at his next annual physical exam. He is to call the office in the meantime if he has any acute issue, questions or concerns.  2. Encounter for hepatitis C screening test for low risk patient Will check labs as below and f/u pending results. - Hepatitis C Antibody  3. Class 1 obesity due to excess calories with serious comorbidity and body mass index (BMI) of 30.0 to 30.9 in adult Counseled patient on healthy lifestyle modifications including dieting and exercise.     No follow-ups on file.     Reynolds Bowl, PA-C, have reviewed all documentation for this visit. The documentation on 06/24/20 for the exam, diagnosis, procedures, and orders are all accurate and complete.   [place]ye Beach  [facility] [phone] (phone) [phone] (fax)  [place]

## 2020-06-24 ENCOUNTER — Ambulatory Visit (INDEPENDENT_AMBULATORY_CARE_PROVIDER_SITE_OTHER): Payer: BC Managed Care – PPO | Admitting: Physician Assistant

## 2020-06-24 ENCOUNTER — Encounter: Payer: Self-pay | Admitting: Physician Assistant

## 2020-06-24 ENCOUNTER — Other Ambulatory Visit: Payer: Self-pay

## 2020-06-24 VITALS — BP 148/92 | HR 76 | Temp 98.4°F | Resp 16 | Ht 67.0 in | Wt 193.5 lb

## 2020-06-24 DIAGNOSIS — E6609 Other obesity due to excess calories: Secondary | ICD-10-CM

## 2020-06-24 DIAGNOSIS — Z0000 Encounter for general adult medical examination without abnormal findings: Secondary | ICD-10-CM | POA: Diagnosis not present

## 2020-06-24 DIAGNOSIS — Z1159 Encounter for screening for other viral diseases: Secondary | ICD-10-CM

## 2020-06-24 DIAGNOSIS — Z6830 Body mass index (BMI) 30.0-30.9, adult: Secondary | ICD-10-CM | POA: Diagnosis not present

## 2020-06-24 DIAGNOSIS — Z125 Encounter for screening for malignant neoplasm of prostate: Secondary | ICD-10-CM | POA: Diagnosis not present

## 2020-06-24 DIAGNOSIS — Z8616 Personal history of COVID-19: Secondary | ICD-10-CM | POA: Diagnosis not present

## 2020-06-24 DIAGNOSIS — E661 Drug-induced obesity: Secondary | ICD-10-CM | POA: Diagnosis not present

## 2020-06-24 NOTE — Patient Instructions (Signed)
Preventive Care 41-48 Years Old, Male Preventive care refers to lifestyle choices and visits with your health care provider that can promote health and wellness. This includes:  A yearly physical exam. This is also called an annual well check.  Regular dental and eye exams.  Immunizations.  Screening for certain conditions.  Healthy lifestyle choices, such as eating a healthy diet, getting regular exercise, not using drugs or products that contain nicotine and tobacco, and limiting alcohol use. What can I expect for my preventive care visit? Physical exam Your health care provider will check:  Height and weight. These may be used to calculate body mass index (BMI), which is a measurement that tells if you are at a healthy weight.  Heart rate and blood pressure.  Your skin for abnormal spots. Counseling Your health care provider may ask you questions about:  Alcohol, tobacco, and drug use.  Emotional well-being.  Home and relationship well-being.  Sexual activity.  Eating habits.  Work and work Statistician. What immunizations do I need?  Influenza (flu) vaccine  This is recommended every year. Tetanus, diphtheria, and pertussis (Tdap) vaccine  You may need a Td booster every 10 years. Varicella (chickenpox) vaccine  You may need this vaccine if you have not already been vaccinated. Zoster (shingles) vaccine  You may need this after age 64. Measles, mumps, and rubella (MMR) vaccine  You may need at least one dose of MMR if you were born in 1957 or later. You may also need a second dose. Pneumococcal conjugate (PCV13) vaccine  You may need this if you have certain conditions and were not previously vaccinated. Pneumococcal polysaccharide (PPSV23) vaccine  You may need one or two doses if you smoke cigarettes or if you have certain conditions. Meningococcal conjugate (MenACWY) vaccine  You may need this if you have certain conditions. Hepatitis A  vaccine  You may need this if you have certain conditions or if you travel or work in places where you may be exposed to hepatitis A. Hepatitis B vaccine  You may need this if you have certain conditions or if you travel or work in places where you may be exposed to hepatitis B. Haemophilus influenzae type b (Hib) vaccine  You may need this if you have certain risk factors. Human papillomavirus (HPV) vaccine  If recommended by your health care provider, you may need three doses over 6 months. You may receive vaccines as individual doses or as more than one vaccine together in one shot (combination vaccines). Talk with your health care provider about the risks and benefits of combination vaccines. What tests do I need? Blood tests  Lipid and cholesterol levels. These may be checked every 5 years, or more frequently if you are over 60 years old.  Hepatitis C test.  Hepatitis B test. Screening  Lung cancer screening. You may have this screening every year starting at age 43 if you have a 30-pack-year history of smoking and currently smoke or have quit within the past 15 years.  Prostate cancer screening. Recommendations will vary depending on your family history and other risks.  Colorectal cancer screening. All adults should have this screening starting at age 72 and continuing until age 2. Your health care provider may recommend screening at age 14 if you are at increased risk. You will have tests every 1-10 years, depending on your results and the type of screening test.  Diabetes screening. This is done by checking your blood sugar (glucose) after you have not eaten  for a while (fasting). You may have this done every 1-3 years.  Sexually transmitted disease (STD) testing. Follow these instructions at home: Eating and drinking  Eat a diet that includes fresh fruits and vegetables, whole grains, lean protein, and low-fat dairy products.  Take vitamin and mineral supplements as  recommended by your health care provider.  Do not drink alcohol if your health care provider tells you not to drink.  If you drink alcohol: ? Limit how much you have to 0-2 drinks a day. ? Be aware of how much alcohol is in your drink. In the [place] one drink equals one 12 oz bottle of beer (355 mL), one 5 oz glass of wine (148 mL), or one 1 oz glass of hard liquor (44 mL). Lifestyle  Take daily care of your teeth and gums.  Stay active. Exercise for at least 30 minutes on 5 or more days each week.  Do not use any products that contain nicotine or tobacco, such as cigarettes, e-cigarettes, and chewing tobacco. If you need help quitting, ask your health care provider.  If you are sexually active, practice safe sex. Use a condom or other form of protection to prevent STIs (sexually transmitted infections).  Talk with your health care provider about taking a low-dose aspirin every day starting at age 53. What's next?  Go to your health care provider once a year for a well check visit.  Ask your health care provider how often you should have your eyes and teeth checked.  Stay up to date on all vaccines. This information is not intended to replace advice given to you by your health care provider. Make sure you discuss any questions you have with your health care provider. Document Revised: 06/28/2018 Document Reviewed: 06/28/2018 Elsevier Patient Education  2020 [employer].

## 2020-06-25 LAB — CBC WITH DIFFERENTIAL/PLATELET
Basophils Absolute: 0 10*3/uL (ref 0.0–0.2)
Basos: 1 %
EOS (ABSOLUTE): 0.2 10*3/uL (ref 0.0–0.4)
Eos: 3 %
Hematocrit: 47.3 % (ref 37.5–51.0)
Hemoglobin: 15.9 g/dL (ref 13.0–17.7)
Immature Grans (Abs): 0 10*3/uL (ref 0.0–0.1)
Immature Granulocytes: 0 %
Lymphocytes Absolute: 2.2 10*3/uL (ref 0.7–3.1)
Lymphs: 34 %
MCH: 29.4 pg (ref 26.6–33.0)
MCHC: 33.6 g/dL (ref 31.5–35.7)
MCV: 88 fL (ref 79–97)
Monocytes Absolute: 0.6 10*3/uL (ref 0.1–0.9)
Monocytes: 9 %
Neutrophils Absolute: 3.4 10*3/uL (ref 1.4–7.0)
Neutrophils: 53 %
Platelets: 203 10*3/uL (ref 150–450)
RBC: 5.4 x10E6/uL (ref 4.14–5.80)
RDW: 12.9 % (ref 11.6–15.4)
WBC: 6.4 10*3/uL (ref 3.4–10.8)

## 2020-06-25 LAB — COMPREHENSIVE METABOLIC PANEL
ALT: 36 IU/L (ref 0–44)
AST: 23 IU/L (ref 0–40)
Albumin/Globulin Ratio: 1.8 (ref 1.2–2.2)
Albumin: 4.9 g/dL (ref 4.0–5.0)
Alkaline Phosphatase: 64 IU/L (ref 44–121)
BUN/Creatinine Ratio: 14 (ref 9–20)
BUN: 17 mg/dL (ref 6–24)
Bilirubin Total: 0.5 mg/dL (ref 0.0–1.2)
CO2: 24 mmol/L (ref 20–29)
Calcium: 9.9 mg/dL (ref 8.7–10.2)
Chloride: 101 mmol/L (ref 96–106)
Creatinine, Ser: 1.2 mg/dL (ref 0.76–1.27)
GFR calc Af Amer: 82 mL/min/{1.73_m2} (ref >59)
GFR calc non Af Amer: 71 mL/min/{1.73_m2} (ref >59)
Globulin, Total: 2.7 g/dL (ref 1.5–4.5)
Glucose: 93 mg/dL (ref 65–99)
Potassium: 4.3 mmol/L (ref 3.5–5.2)
Sodium: 140 mmol/L (ref 134–144)
Total Protein: 7.6 g/dL (ref 6.0–8.5)

## 2020-06-25 LAB — LIPID PANEL WITH LDL/HDL RATIO
Cholesterol, Total: 249 mg/dL — ABNORMAL HIGH (ref 100–199)
HDL: 29 mg/dL — ABNORMAL LOW (ref >39)
LDL Chol Calc (NIH): 140 mg/dL — ABNORMAL HIGH (ref 0–99)
LDL/HDL Ratio: 4.8 ratio — ABNORMAL HIGH (ref 0.0–3.6)
Triglycerides: 430 mg/dL — ABNORMAL HIGH (ref 0–149)
VLDL Cholesterol Cal: 80 mg/dL — ABNORMAL HIGH (ref 5–40)

## 2020-06-25 LAB — TSH: TSH: 4.31 u[IU]/mL (ref 0.450–4.500)

## 2020-06-25 LAB — SARS-COV-2 SEMI-QUANTITATIVE TOTAL ANTIBODY, SPIKE
SARS-CoV-2 Semi-Quant Total Ab: >2500 U/mL (ref <0.8)
SARS-CoV-2 Spike Ab Interp: POSITIVE

## 2020-06-25 LAB — PSA: Prostate Specific Ag, Serum: 2.2 ng/mL (ref 0.0–4.0)

## 2020-06-25 LAB — HEPATITIS C ANTIBODY: Hep C Virus Ab: <0.1 s/co ratio (ref 0.0–0.9)

## 2020-07-02 ENCOUNTER — Encounter: Payer: BC Managed Care – PPO | Admitting: Physician Assistant

## 2020-09-29 ENCOUNTER — Encounter: Payer: Self-pay | Admitting: Physician Assistant

## 2020-09-29 DIAGNOSIS — Z1211 Encounter for screening for malignant neoplasm of colon: Secondary | ICD-10-CM

## 2020-10-08 ENCOUNTER — Ambulatory Visit: Payer: BC Managed Care – PPO | Admitting: Physician Assistant

## 2020-10-14 ENCOUNTER — Telehealth (INDEPENDENT_AMBULATORY_CARE_PROVIDER_SITE_OTHER): Payer: BC Managed Care – PPO | Admitting: Physician Assistant

## 2020-10-14 DIAGNOSIS — F418 Other specified anxiety disorders: Secondary | ICD-10-CM

## 2020-10-14 MED ORDER — ALPRAZOLAM 0.25 MG PO TABS: 0.2500 mg | ORAL_TABLET | Freq: Two times a day (BID) | ORAL | 0 refills | Status: DC | PRN

## 2020-10-14 NOTE — Progress Notes (Signed)
MyChart Video Visit    Virtual Visit via Video Note   This visit type was conducted due to national recommendations for restrictions regarding the COVID-19 Pandemic (e.g. social distancing) in an effort to limit this patient's exposure and mitigate transmission in our community. This patient is at least at moderate risk for complications without adequate follow up. This format is felt to be most appropriate for this patient at this time. Physical exam was limited by quality of the video and audio technology used for the visit.   Patient location: Home Provider location: [facility]  I discussed the limitations of evaluation and management by telemedicine and the availability of in person appointments. The patient expressed understanding and agreed to proceed.  Patient: Samuel Tyler.   DOB: [birth year]   50 y.o. Male  MRN: [id number] Visit Date: 10/14/2020  Today's healthcare provider: Mar Daring, PA-C   No chief complaint on file.  Subjective    HPI  Patient states that he is needing medication for a flight. He states this is his first time ever flying and he his very nervous.    Medications: No outpatient medications prior to visit.   No facility-administered medications prior to visit.    Review of Systems  Last CBC Lab Results  Component Value Date   WBC 6.4 06/24/2020   HGB 15.9 06/24/2020   HCT 47.3 06/24/2020   MCV 88 06/24/2020   MCH 29.4 06/24/2020   RDW 12.9 06/24/2020   PLT 203 63/14/9702   Last metabolic panel Lab Results  Component Value Date   GLUCOSE 93 06/24/2020   NA 140 06/24/2020   K 4.3 06/24/2020   CL 101 06/24/2020   CO2 24 06/24/2020   BUN 17 06/24/2020   CREATININE 1.20 06/24/2020   GFRNONAA 71 06/24/2020   GFRAA 82 06/24/2020   CALCIUM 9.9 06/24/2020   PROT 7.6 10/19/2020   ALBUMIN 5.1 (H) 10/19/2020   LABGLOB 2.7 06/24/2020   AGRATIO 1.8 06/24/2020   BILITOT 0.8 10/19/2020   ALKPHOS 65 10/19/2020   AST 25 10/19/2020    ALT 43 10/19/2020      Objective    There were no vitals taken for this visit. BP Readings from Last 3 Encounters:  10/19/20 (!) 147/96  06/24/20 (!) 148/92  02/06/20 (!) 144/91   Wt Readings from Last 3 Encounters:  10/19/20 195 lb (88.5 kg)  06/24/20 193 lb 8 oz (87.8 kg)  02/06/20 197 lb (89.4 kg)      Physical Exam Vitals reviewed.  Constitutional:      General: He is not in acute distress.    Appearance: Normal appearance. He is well-developed. He is not ill-appearing.  HENT:     Head: Normocephalic and atraumatic.  Eyes:     Conjunctiva/sclera: Conjunctivae normal.  Pulmonary:     Effort: Pulmonary effort is normal. No respiratory distress.  Musculoskeletal:     Cervical back: Normal range of motion and neck supple.  Neurological:     Mental Status: He is alert.  Psychiatric:        Mood and Affect: Mood normal.        Behavior: Behavior normal.        Thought Content: Thought content normal.        Judgment: Judgment normal.        Assessment & Plan     1. Situational anxiety Will give alprazolam as below for PRN anxiety with flying. Has trip to [place] coming up.  -  ALPRAZolam (XANAX) 0.25 MG tablet; Take 1 tablet (0.25 mg total) by mouth 2 (two) times daily as needed for anxiety.  Dispense: 10 tablet; Refill: 0   No follow-ups on file.     I discussed the assessment and treatment plan with the patient. The patient was provided an opportunity to ask questions and all were answered. The patient agreed with the plan and demonstrated an understanding of the instructions.   The patient was advised to call back or seek an in-person evaluation if the symptoms worsen or if the condition fails to improve as anticipated.  I provided 12 minutes of face-to-face time during this encounter via MyChart Video enabled encounter.  Reynolds Bowl, PA-C, have reviewed all documentation for this visit. The documentation on 10/25/20 for the exam, diagnosis,  procedures, and orders are all accurate and complete.  [place] Beach [facility] [phone] (phone) [phone] (fax)  [place]

## 2020-10-14 NOTE — Patient Instructions (Signed)
Alprazolam tablets What is this medicine? ALPRAZOLAM (al PRAY zoe lam) is a benzodiazepine. It is used to treat anxiety and panic attacks. This medicine may be used for other purposes; ask your health care provider or pharmacist if you have questions. COMMON BRAND NAME(S): Xanax What should I tell my health care provider before I take this medicine? They need to know if you have any of these conditions:  depression or other mental health disease  history of alcohol or medicine abuse or addiction  kidney disease  liver disease  lung disease, asthma, or breathing problem  seizures  suicidal thoughts, plans or attempt  an unusual or allergic reaction to alprazolam, other benzodiazepines, foods, dyes, or preservatives  pregnant or trying to get pregnant  breast-feeding How should I use this medicine? Take this medicine by mouth. Take it as directed on the prescription label. Do not take it more often than directed. Keep taking it unless your health care provider tells you to stop. A special MedGuide will be given to you by the pharmacist with each prescription and refill. Be sure to read this information carefully each time. Talk to your health care provider about the use of this medicine in children. Special care may be needed. Patients over 49 years of age may have a stronger reaction and need a smaller dose. Overdosage: If you think you have taken too much of this medicine contact a poison control center or emergency room at once. NOTE: This medicine is only for you. Do not share this medicine with others. What if I miss a dose? If you miss a dose, take it as soon as you can. If it is almost time for your next dose, take only that dose. Do not take double or extra doses. What may interact with this medicine? Do not take this medicine with any of the following medications:  certain antivirals for HIV or hepatitis  certain medicines for fungal infections like ketoconazole,  itraconazole, or posaconazole  clarithromycin  grapefruit juice  narcotic medicines for cough  sodium oxybate This medicine may also interact with the following medications:  alcohol  antihistamines for allergy, cough and cold  certain medicines for anxiety or sleep  certain medicines for depression like amitriptyline, fluoxetine, fluvoxamine, nefazodone, sertraline  certain medicines for seizures like carbamazepine, phenobarbital, phenytoin, primidone  cimetidine  digoxin  erythromycin  male hormones, like estrogens or progestins and birth control pills, patches, rings, or injections  general anesthetics like halothane, isoflurane, methoxyflurane, propofol  medicines that relax muscles  narcotic medicines for pain  phenothiazines like chlorpromazine, mesoridazine, prochlorperazine, thioridazine This list may not describe all possible interactions. Give your health care provider a list of all the medicines, herbs, non-prescription drugs, or dietary supplements you use. Also tell them if you smoke, drink alcohol, or use illegal drugs. Some items may interact with your medicine. What should I watch for while using this medicine? Visit your health care provider for regular checks on your progress. Tell your health care provider if your symptoms do not start to get better or if they get worse. Do not stop taking except on your doctor's advice. You may develop a severe reaction. Your doctor will tell you how much medicine to take. You may get drowsy or dizzy. Do not drive, use machinery, or do anything that needs mental alertness until you know how this medicine affects you. To reduce the risk of dizzy and fainting spells, do not stand or sit up quickly, especially if you are  an older patient. Alcohol may increase dizziness and drowsiness. Avoid alcoholic drinks. If you are taking another medicine that also causes drowsiness, you may have more side effects. Give your health care  provider a list of all medicines you use. Your doctor will tell you how much medicine to take. Do not take more medicine than directed. Call emergency for help if you have problems breathing or unusual sleepiness. Women should inform their health care provider if they wish to become pregnant or think they might be pregnant. Do not breast-feed while taking this medicine. Talk to your health care provider for more information. What side effects may I notice from receiving this medicine? Side effects that you should report to your doctor or health care professional as soon as possible:  allergic reactions like skin rash, itching or hives, swelling of the face, lips, or tongue  confusion  loss of balance or coordination  signs and symptoms of low blood pressure like dizziness; feeling faint or lightheaded, falls; unusually weak or tired  suicidal thoughts or other mood changes  trouble breathing  trouble saying things clearly Side effects that usually do not require medical attention (report to your doctor or health care professional if they continue or are bothersome):  changes in sex drive  drowsiness  dry mouth  tiredness This list may not describe all possible side effects. Call your doctor for medical advice about side effects. You may report side effects to FDA at 1-800-FDA-1088. Where should I keep my medicine? Keep out of the reach of children and pets. This medicine can be abused. Keep it in a safe place to protect it from theft. Do not share it with anyone. It is only for you. Selling or giving away this medicine is dangerous and against the law. Store at room temperature between 20 and 25 degrees C (68 and 77 degrees F). Get rid of any unused medicine after the expiration date. This medicine may cause harm and death if it is taken by other adults, children, or pets. It is important to get rid of the medicine as soon as you no longer need it or it is expired. You can do this in  two ways:  Take the medicine to a medicine take-back program. Check with your pharmacy or law enforcement to find a location.  If you cannot return the medicine, check the label or package insert to see if the medicine should be thrown out in the garbage or flushed down the toilet. If you are not sure, ask your health care provider. If it is safe to put it in the trash, take the medicine out of the container. Mix the medicine with cat litter, dirt, coffee grounds, or other unwanted substance. Seal the mixture in a bag or container. Put it in the trash. NOTE: This sheet is a summary. It may not cover all possible information. If you have questions about this medicine, talk to your doctor, pharmacist, or health care provider.  2021 Elsevier/Gold Standard (2020-01-06 15:56:13)

## 2020-10-19 ENCOUNTER — Other Ambulatory Visit: Payer: Self-pay

## 2020-10-19 ENCOUNTER — Encounter: Payer: Self-pay | Admitting: Physician Assistant

## 2020-10-19 ENCOUNTER — Ambulatory Visit (INDEPENDENT_AMBULATORY_CARE_PROVIDER_SITE_OTHER): Payer: BC Managed Care – PPO | Admitting: Physician Assistant

## 2020-10-19 VITALS — BP 147/96 | HR 88 | Temp 98.7°F | Wt 195.0 lb

## 2020-10-19 DIAGNOSIS — E7800 Pure hypercholesterolemia, unspecified: Secondary | ICD-10-CM

## 2020-10-19 DIAGNOSIS — K429 Umbilical hernia without obstruction or gangrene: Secondary | ICD-10-CM | POA: Diagnosis not present

## 2020-10-19 NOTE — Progress Notes (Signed)
Established patient visit   Patient: Samuel Tyler.   DOB: 1970/11/28   50 y.o. Male  MRN: [id number] Visit Date: 10/19/2020  Today's healthcare provider: Mar Daring, PA-C   Chief Complaint  Patient presents with  . Mass   Subjective    HPI  Pt comes in today complaining of a possible hernia.  Lipid/Cholesterol, Follow-up  Last lipid panel Other pertinent labs  Lab Results  Component Value Date   CHOL 249 (H) 06/24/2020   HDL 29 (L) 06/24/2020   LDLCALC 140 (H) 06/24/2020   TRIG 430 (H) 06/24/2020   CHOLHDL 7.8 (H) 07/01/2019   Lab Results  Component Value Date   ALT 36 06/24/2020   AST 23 06/24/2020   PLT 203 06/24/2020   TSH 4.310 06/24/2020     He was last seen for this 4 months ago.  Management since that visit includes working on lifestyle changes.  He reports excellent compliance with treatment. He is not having side effects.   Symptoms: No chest pain No chest pressure/discomfort  No dyspnea No lower extremity edema  No numbness or tingling of extremity No orthopnea  No palpitations No paroxysmal nocturnal dyspnea  No speech difficulty No syncope   Current diet: in general, a "healthy" diet   Current exercise: none  The 10-year ASCVD risk score Mikey Bussing DC Brooke Bonito., et al., 2013) is: 9.1%  ---------------------------------------------------------------------------------------------------   Patient Active Problem List   Diagnosis Date Noted  . 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 History of chicken pox 02/01/2016   No past medical history on file. Social History   Tobacco Use  . Smoking status: Passive Smoke Exposure - Never Smoker  . Smokeless tobacco: Never Used  Substance Use Topics  . Alcohol use: Yes  . Drug use: No   No Known Allergies   Medications: Outpatient Medications Prior to Visit  Medication Sig  . ALPRAZolam (XANAX) 0.25 MG tablet Take 1 tablet (0.25 mg total) by mouth 2 (two) times daily as needed for anxiety.   No  facility-administered medications prior to visit.    Review of Systems  Constitutional: Negative.   Respiratory: Negative.   Cardiovascular: Negative.   Gastrointestinal: Negative.  Negative for abdominal pain.        Objective    BP (!) 147/96 (BP Location: Left Arm, Patient Position: Sitting, Cuff Size: Large)   Pulse 88   Temp 98.7 F (37.1 C) (Oral)   Wt 195 lb (88.5 kg)   BMI 30.54 kg/m    Physical Exam Vitals reviewed.  Constitutional:      General: He is not in acute distress.    Appearance: Normal appearance. He is well-developed. He is obese.  HENT:     Head: Normocephalic and atraumatic.  Eyes:     Conjunctiva/sclera: Conjunctivae normal.  Pulmonary:     Effort: Pulmonary effort is normal. No respiratory distress.  Abdominal:     General: Abdomen is flat. Bowel sounds are normal.     Hernia: A hernia (umbilical) is present.  Musculoskeletal:     Cervical back: Normal range of motion and neck supple.  Neurological:     Mental Status: He is alert.  Psychiatric:        Mood and Affect: Mood normal.        Behavior: Behavior normal.        Thought Content: Thought content normal.        Judgment: Judgment normal.  No results found for any visits on 10/19/20.  Assessment & Plan     1. Umbilical hernia without obstruction and without gangrene New finding. Not causing any pain. Will monitor. Does have h/o repair to right inguinal hernia.  2. Hypercholesterolemia Diet controlled. Will check labs as below and f/u pending results. - Lipid Panel With LDL/HDL Ratio - Hepatic function panel   No follow-ups on file.      Reynolds Bowl, PA-C, have reviewed all documentation for this visit. The documentation on 10/19/20 for the exam, diagnosis, procedures, and orders are all accurate and complete.   Rubye Beach  [facility] [phone] (phone) [phone] (fax)  [place]

## 2020-10-19 NOTE — Patient Instructions (Addendum)
Samuel Tyler GI: Laurine Blazer 10/29/22 at 40:10   Umbilical Hernia, Adult  A hernia is a bulge of tissue that pushes through an opening between muscles. An umbilical hernia happens in the abdomen, near the belly button (umbilicus). The hernia may contain tissues from the small intestine, large intestine, or fatty tissue covering the intestines (omentum). Umbilical hernias in adults tend to get worse over time, and they require surgical treatment. There are several types of umbilical hernias. You may have:  A hernia located just above or below the umbilicus (indirect hernia). This is the most common type of umbilical hernia in adults.  A hernia that forms through an opening formed by the umbilicus (direct hernia).  A hernia that comes and goes (reducible hernia). A reducible hernia may be visible only when you strain, lift something heavy, or cough. This type of hernia can be pushed back into the abdomen (reduced).  A hernia that traps abdominal tissue inside the hernia (incarcerated hernia). This type of hernia cannot be reduced.  A hernia that cuts off blood flow to the tissues inside the hernia (strangulated hernia). The tissues can start to die if this happens. This type of hernia requires emergency treatment. What are the causes? An umbilical hernia happens when tissue inside the abdomen presses on a weak area of the abdominal muscles. What increases the risk? You may have a greater risk of this condition if you:  Are obese.  Have had several pregnancies.  Have a buildup of fluid inside your abdomen (ascites).  Have had surgery that weakens the abdominal muscles. What are the signs or symptoms? The main symptom of this condition is a painless bulge at or near the belly button. A reducible hernia may be visible only when you strain, lift something heavy, or cough. Other symptoms may include:  Dull pain.  A feeling of pressure. Symptoms of a strangulated hernia may include:  Pain  that gets increasingly worse.  Nausea and vomiting.  Pain when pressing on the hernia.  Skin over the hernia becoming red or purple.  Constipation.  Blood in the stool. How is this diagnosed? This condition may be diagnosed based on:  A physical exam. You may be asked to cough or strain while standing. These actions increase the pressure inside your abdomen and force the hernia through the opening in your muscles. Your health care provider may try to reduce the hernia by pressing on it.  Your symptoms and medical history. How is this treated? Surgery is the only treatment for an umbilical hernia. Surgery for a strangulated hernia is done as soon as possible. If you have a small hernia that is not incarcerated, you may need to lose weight before having surgery. Follow these instructions at home:  Lose weight, if told by your health care provider.  Do not try to push the hernia back in.  Watch your hernia for any changes in color or size. Tell your health care provider if any changes occur.  You may need to avoid activities that increase pressure on your hernia.  Do not lift anything that is heavier than 10 lb (4.5 kg) until your health care provider says that this is safe.  Take over-the-counter and prescription medicines only as told by your health care provider.  Keep all follow-up visits as told by your health care provider. This is important. Contact a health care provider if:  Your hernia gets larger.  Your hernia becomes painful. Get help right away if:  You develop  sudden, severe pain near the area of your hernia.  You have pain as well as nausea or vomiting.  You have pain and the skin over your hernia changes color.  You develop a fever. This information is not intended to replace advice given to you by your health care provider. Make sure you discuss any questions you have with your health care provider. Document Revised: 08/16/2017 Document Reviewed:  01/02/2017 Elsevier Patient Education  [place].

## 2020-10-20 LAB — HEPATIC FUNCTION PANEL
ALT: 43 IU/L (ref 0–44)
AST: 25 IU/L (ref 0–40)
Albumin: 5.1 g/dL — ABNORMAL HIGH (ref 4.0–5.0)
Alkaline Phosphatase: 65 IU/L (ref 44–121)
Bilirubin Total: 0.8 mg/dL (ref 0.0–1.2)
Bilirubin, Direct: 0.14 mg/dL (ref 0.00–0.40)
Total Protein: 7.6 g/dL (ref 6.0–8.5)

## 2020-10-20 LAB — LIPID PANEL WITH LDL/HDL RATIO
Cholesterol, Total: 233 mg/dL — ABNORMAL HIGH (ref 100–199)
HDL: 30 mg/dL — ABNORMAL LOW (ref >39)
LDL Chol Calc (NIH): 142 mg/dL — ABNORMAL HIGH (ref 0–99)
LDL/HDL Ratio: 4.7 ratio — ABNORMAL HIGH (ref 0.0–3.6)
Triglycerides: 333 mg/dL — ABNORMAL HIGH (ref 0–149)
VLDL Cholesterol Cal: 61 mg/dL — ABNORMAL HIGH (ref 5–40)

## 2020-10-22 ENCOUNTER — Encounter: Payer: Self-pay | Admitting: Physician Assistant

## 2020-10-22 DIAGNOSIS — E7800 Pure hypercholesterolemia, unspecified: Secondary | ICD-10-CM

## 2020-10-22 MED ORDER — ATORVASTATIN CALCIUM 20 MG PO TABS: 20.0000 mg | ORAL_TABLET | Freq: Every day | ORAL | 3 refills | Status: DC

## 2020-10-25 ENCOUNTER — Encounter: Payer: Self-pay | Admitting: Physician Assistant

## 2020-11-30 ENCOUNTER — Telehealth: Payer: Self-pay | Admitting: Family Medicine

## 2020-11-30 DIAGNOSIS — Z1211 Encounter for screening for malignant neoplasm of colon: Secondary | ICD-10-CM

## 2020-11-30 NOTE — Telephone Encounter (Signed)
Copied from [place] [phone] [phone]. Topic: Referral - Request for Referral >> Nov 30, 2020 10:27 AM Leward Quan A wrote: Has patient seen PCP for this complaint? Yes.   *If NO, is insurance requiring patient see PCP for this issue before PCP can refer them? Referral for which specialty: Gastroenterology  Preferred provider/office: [place] Reason for referral: Colonoscopy

## 2020-12-10 DIAGNOSIS — Z20822 Contact with and (suspected) exposure to covid-19: Secondary | ICD-10-CM | POA: Diagnosis not present

## 2021-01-31 IMAGING — US US SOFT TISSUE
1 series · 7 of 7 positions shown · non-contrast
Comparison: None.

CLINICAL DATA: Well-circumscribed 2 x 1 cm mass on right flank.
Nontender.

EXAM:
ULTRASOUND ABDOMEN LIMITED

[Series 1: us soft tissue · 0.07mm/px · 7 of 7 slices shown]
[im 1/7]
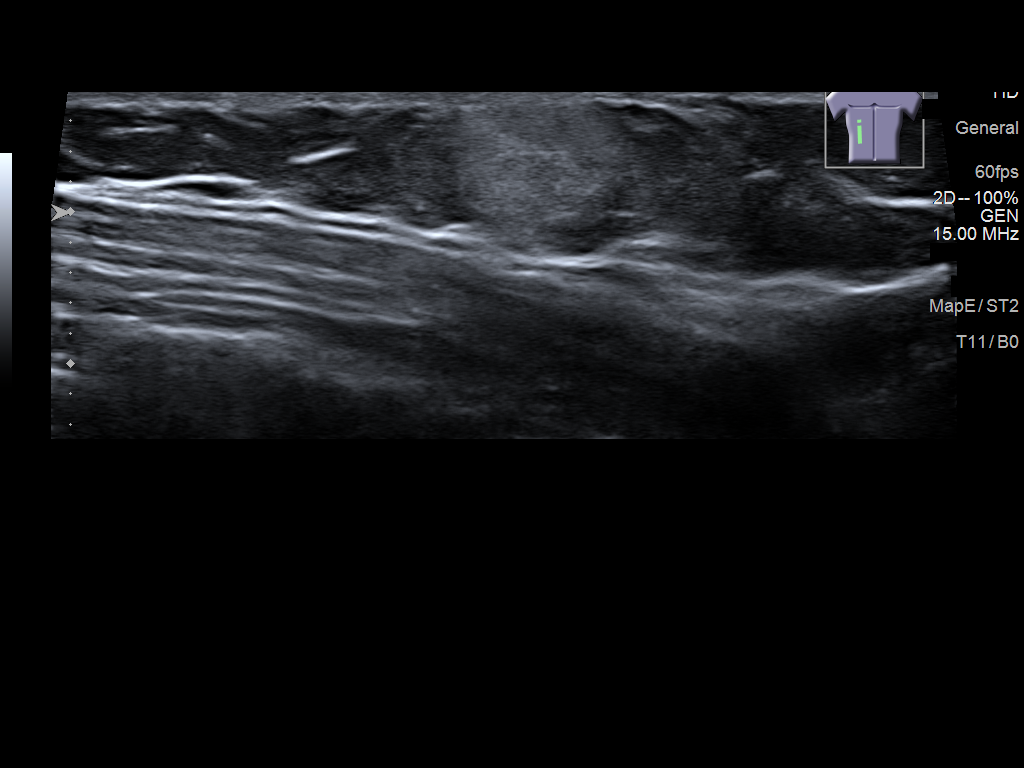
[im 2/7]
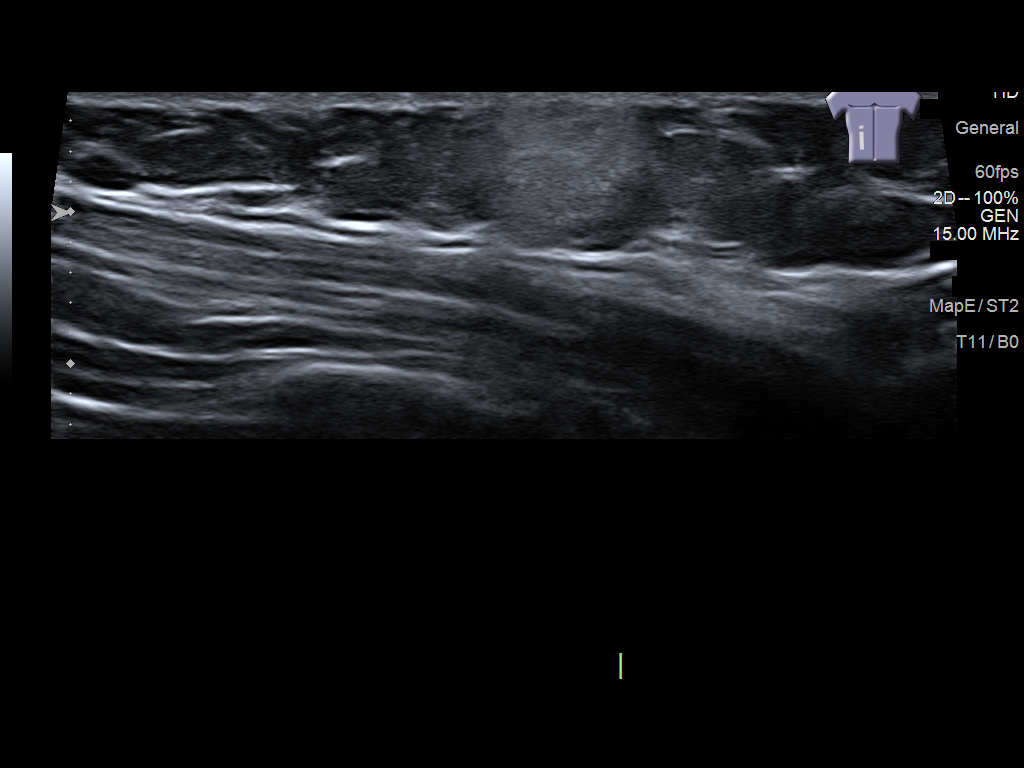
[im 3/7]
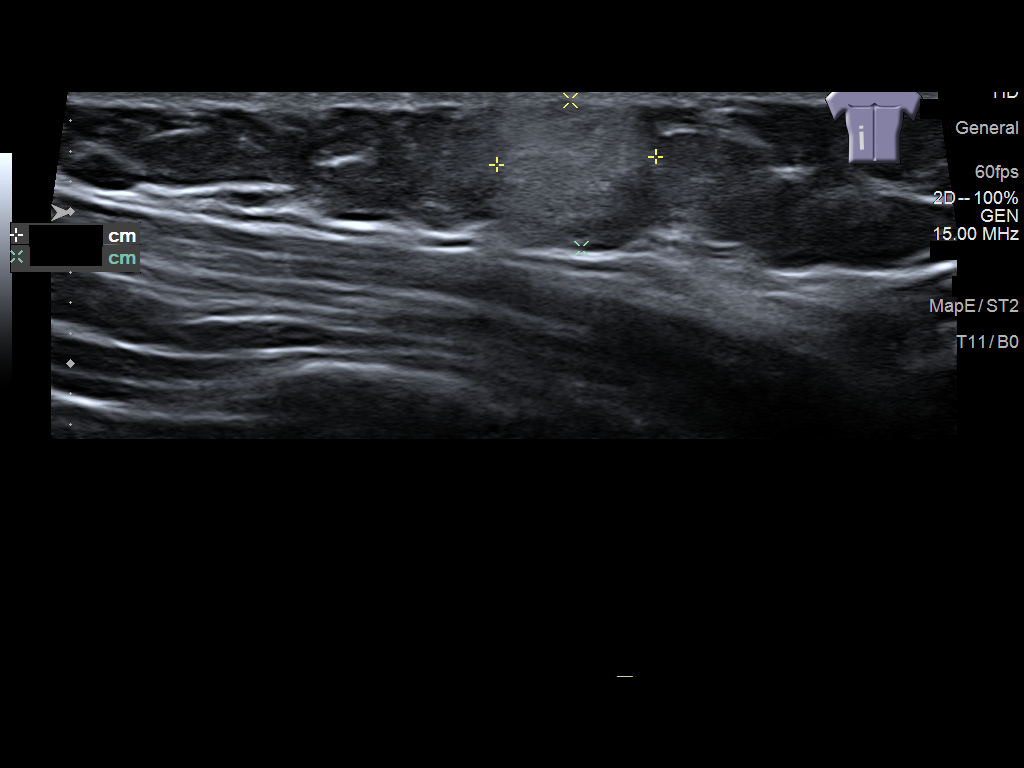
[im 4/7]
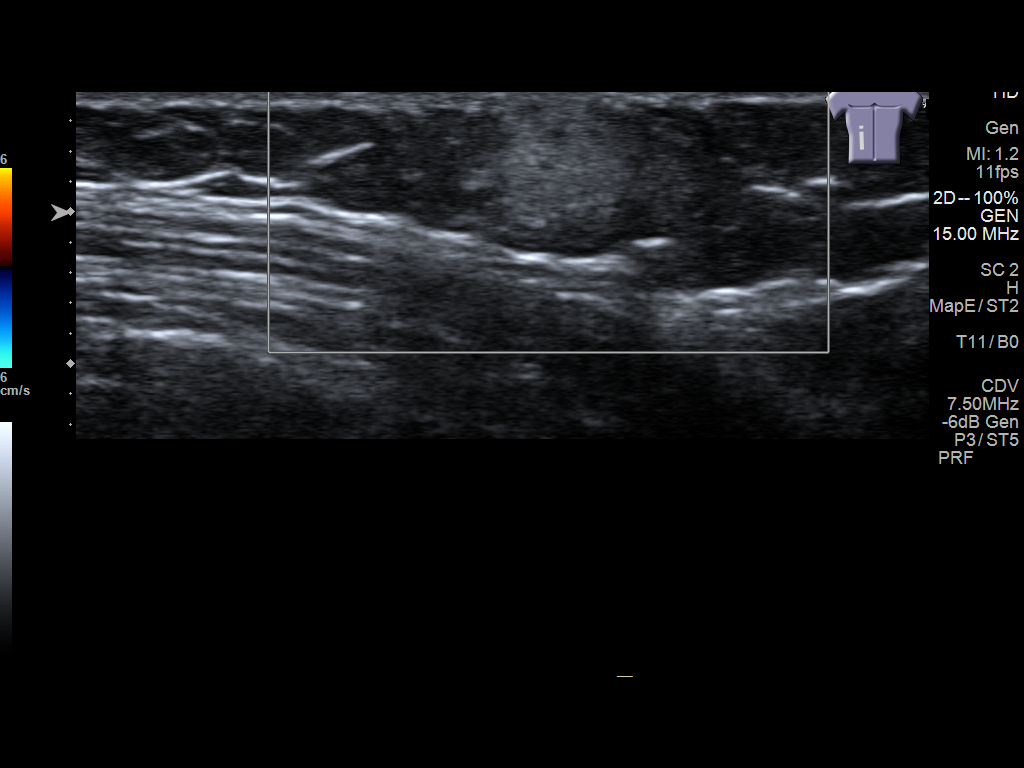
[im 5/7]
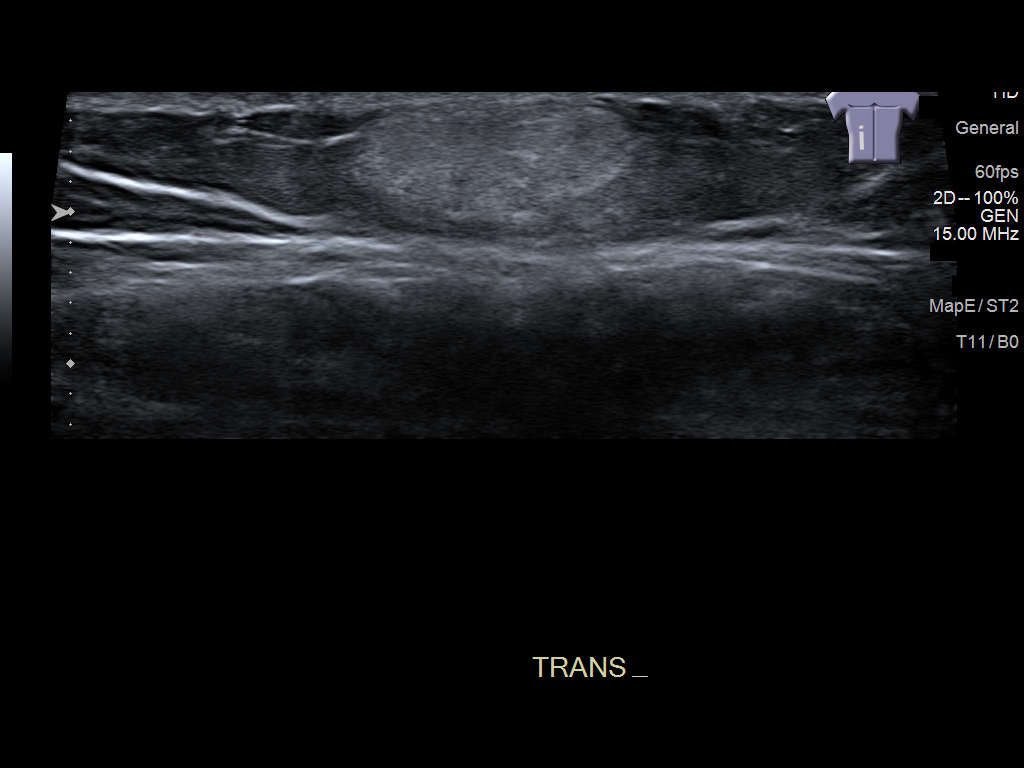
[im 6/7]
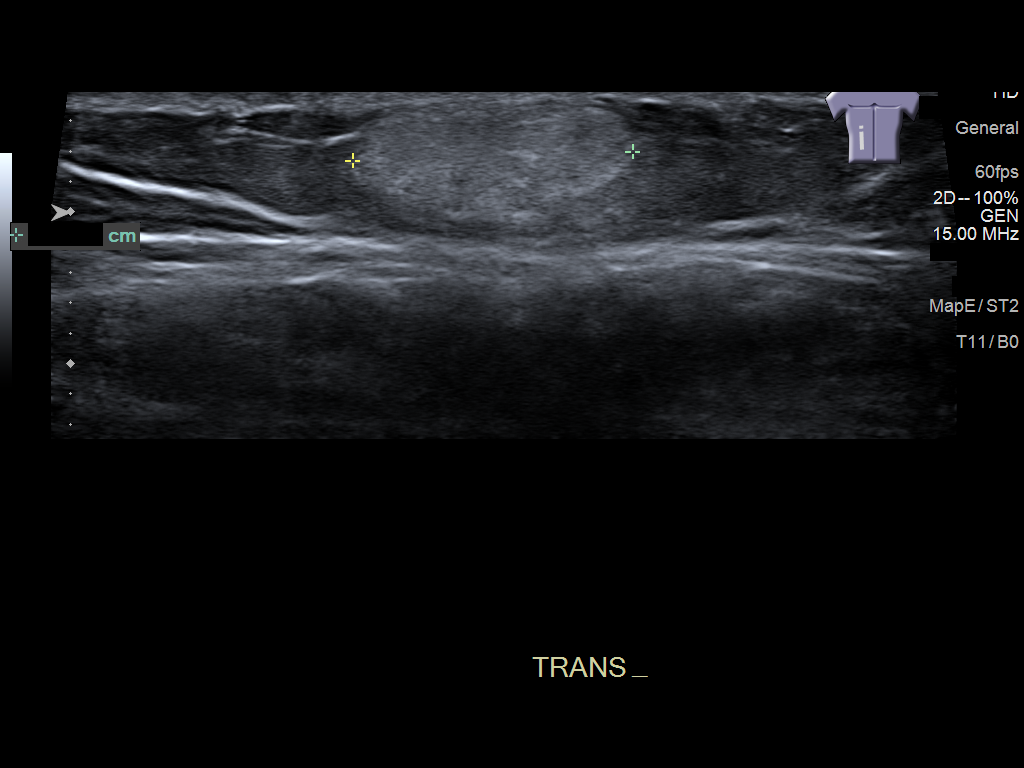
[im 7/7]
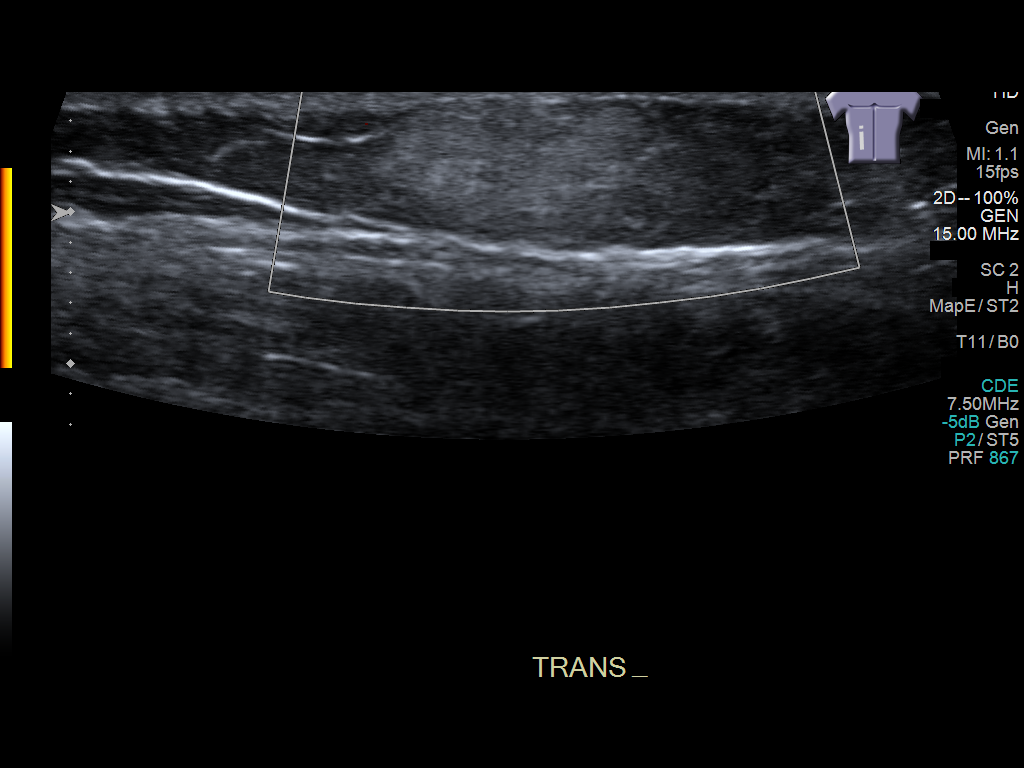

[7 of 7 positions shown; findings below may reference images not displayed]

FINDINGS: Targeted sonographic evaluation evaluation in the area of clinical
concern labeled right upper flank area. There is an echogenic 1.1 x
1 x 1.9 cm well-circumscribed lesion in the subcutaneous tissues. No
internal or peripheral vascularity. No fluid collection or cystic
component. No evidence of calcification.
IMPRESSION: Well-circumscribed echogenic structure in the area of clinical
concern of the right flank has sonographic findings typical of a
cm lipoma. Recommend continued clinical follow-up.

## 2021-03-03 DIAGNOSIS — Z01818 Encounter for other preprocedural examination: Secondary | ICD-10-CM | POA: Diagnosis not present

## 2021-03-03 DIAGNOSIS — Z1211 Encounter for screening for malignant neoplasm of colon: Secondary | ICD-10-CM | POA: Diagnosis not present

## 2021-04-07 DIAGNOSIS — K635 Polyp of colon: Secondary | ICD-10-CM | POA: Diagnosis not present

## 2021-04-07 DIAGNOSIS — K5140 Inflammatory polyps of colon without complications: Secondary | ICD-10-CM | POA: Diagnosis not present

## 2021-04-07 DIAGNOSIS — K621 Rectal polyp: Secondary | ICD-10-CM | POA: Diagnosis not present

## 2021-04-07 DIAGNOSIS — Z1211 Encounter for screening for malignant neoplasm of colon: Secondary | ICD-10-CM | POA: Diagnosis not present

## 2021-04-07 DIAGNOSIS — D128 Benign neoplasm of rectum: Secondary | ICD-10-CM | POA: Diagnosis not present

## 2021-04-07 LAB — HM COLONOSCOPY

## 2021-10-19 ENCOUNTER — Other Ambulatory Visit: Payer: Self-pay | Admitting: Physician Assistant

## 2021-10-19 DIAGNOSIS — E7800 Pure hypercholesterolemia, unspecified: Secondary | ICD-10-CM

## 2021-10-22 ENCOUNTER — Ambulatory Visit (INDEPENDENT_AMBULATORY_CARE_PROVIDER_SITE_OTHER): Payer: BC Managed Care – PPO | Admitting: Physician Assistant

## 2021-10-22 ENCOUNTER — Encounter: Payer: Self-pay | Admitting: Physician Assistant

## 2021-10-22 VITALS — BP 149/99 | HR 66 | Ht 67.0 in | Wt 204.0 lb

## 2021-10-22 DIAGNOSIS — Z6831 Body mass index (BMI) 31.0-31.9, adult: Secondary | ICD-10-CM | POA: Diagnosis not present

## 2021-10-22 DIAGNOSIS — E7800 Pure hypercholesterolemia, unspecified: Secondary | ICD-10-CM | POA: Diagnosis not present

## 2021-10-22 DIAGNOSIS — R030 Elevated blood-pressure reading, without diagnosis of hypertension: Secondary | ICD-10-CM | POA: Diagnosis not present

## 2021-10-22 DIAGNOSIS — G5692 Unspecified mononeuropathy of left upper limb: Secondary | ICD-10-CM | POA: Diagnosis not present

## 2021-10-22 NOTE — Progress Notes (Signed)
? ?I,Aronda Burford,acting as a scribe for [employer], PA-C.,have documented all relevant documentation on the behalf of Mikey Kirschner, PA-C,as directed by  Mikey Kirschner, PA-C while in the presence of Mikey Kirschner, PA-C. ? ?Established Patient Office Visit ? ?Subjective:  ?Patient ID: Samuel Tyler., male    DOB: July 29, 1970  Age: 51 y.o. MRN: [id number] ? ?CC: Samuel Tyler. presents for medication refill. ? ?HPI ?L index fing pain ?-Since January 2023, the first PIP of his left index finger. Mainly w/ pressure. Improved since January.  ?Denies numbness, denies radiation of pain denies changes in ROM ?-Patient reports burning sensation in left index finger since January.  ? ?Lipid/Cholesterol, Follow-up ? ?Last lipid panel Other pertinent labs  ?Lab Results  ?Component Value Date  ? CHOL 233 (H) 10/19/2020  ? HDL 30 (L) 10/19/2020  ? LDLCALC 142 (H) 10/19/2020  ? TRIG 333 (H) 10/19/2020  ? CHOLHDL 7.8 (H) 07/01/2019  ? Lab Results  ?Component Value Date  ? ALT 43 10/19/2020  ? AST 25 10/19/2020  ? PLT 203 06/24/2020  ? TSH 4.310 06/24/2020  ?  ? ?He was last seen for this 1 years ago.  ?Management since that visit includes review labs. ? ?He reports good compliance with treatment. ?He is not having side effects.  ? ?Symptoms: ?No chest pain No chest pressure/discomfort  ?No dyspnea No lower extremity edema  ?No numbness or tingling of extremity No orthopnea  ?No palpitations No paroxysmal nocturnal dyspnea  ?No speech difficulty No syncope  ? ?Current diet: in general, a "healthy" diet  , well balanced ?Current exercise: no regular exercise ? ?The 10-year ASCVD risk score (Arnett DK, et al., 2019) is: 8.8% ? ?--------------------------------------------------------------------------------------------------- ? ? ?Past Medical History:  ?Diagnosis Date  ? Anxiety   ? Hyperlipidemia   ? ? ?Past Surgical History:  ?Procedure Laterality Date  ? HERNIA REPAIR    ? WISDOM TOOTH EXTRACTION    ? ? ?Family  History  ?Problem Relation Age of Onset  ? Hypertension Mother   ? ? ?Social History  ? ?Socioeconomic History  ? Marital status: Married  ?  Spouse name: Not on file  ? Number of children: Not on file  ? Years of education: Not on file  ? Highest education level: Not on file  ?Occupational History  ? Not on file  ?Tobacco Use  ? Smoking status: Never  ?  Passive exposure: Yes  ? Smokeless tobacco: Never  ?Substance and Sexual Activity  ? Alcohol use: Yes  ? Drug use: No  ? Sexual activity: Yes  ?  Partners: Female  ?  Birth control/protection: None  ?Other Topics Concern  ? Not on file  ?Social History Narrative  ? Not on file  ? ?Social Determinants of Health  ? ?Financial Resource Strain: Not on file  ?Food Insecurity: Not on file  ?Transportation Needs: Not on file  ?Physical Activity: Not on file  ?Stress: Not on file  ?Social Connections: Not on file  ?Intimate Partner Violence: Not on file  ? ? ?Outpatient Medications Prior to Visit  ?Medication Sig Dispense Refill  ? atorvastatin (LIPITOR) 20 MG tablet TAKE 1 TABLET BY MOUTH EVERY DAY 30 tablet 0  ? ALPRAZolam (XANAX) 0.25 MG tablet Take 1 tablet (0.25 mg total) by mouth 2 (two) times daily as needed for anxiety. (Patient not taking: Reported on 10/22/2021) 10 tablet 0  ? ascorbic acid (VITAMIN C) 1000 MG tablet Take by mouth. (Patient not  taking: Reported on 10/22/2021)    ? Cholecalciferol 25 MCG (1000 UT) tablet Take by mouth. (Patient not taking: Reported on 10/22/2021)    ? zinc gluconate 3.75 mg/mL SOLN Take by mouth. (Patient not taking: Reported on 10/22/2021)    ? ?No facility-administered medications prior to visit.  ? ? ?No Known Allergies ? ?ROS ?Review of Systems  ?Constitutional:  Negative for fatigue and fever.  ?Respiratory:  Negative for cough and shortness of breath.   ?Cardiovascular:  Negative for chest pain, palpitations and leg swelling.  ?Musculoskeletal:  Positive for arthralgias.  ?Neurological:  Negative for dizziness and headaches.  ? ?   ?Objective:  ?  ?Physical Exam ?Constitutional:   ?   Appearance: Normal appearance. He is not ill-appearing.  ?HENT:  ?   Head: Normocephalic.  ?Eyes:  ?   Conjunctiva/sclera: Conjunctivae normal.  ?Cardiovascular:  ?   Rate and Rhythm: Normal rate and regular rhythm.  ?   Pulses: Normal pulses.  ?Pulmonary:  ?   Effort: Pulmonary effort is normal.  ?   Breath sounds: Normal breath sounds.  ?Musculoskeletal:  ?   Right lower leg: No edema.  ?   Left lower leg: No edema.  ?Neurological:  ?   Mental Status: He is oriented to person, place, and time.  ?Psychiatric:     ?   Mood and Affect: Mood normal.     ?   Behavior: Behavior normal.  ? ? ?BP (!) 149/99 (BP Location: Right Arm, Patient Position: Sitting, Cuff Size: Large)   Pulse 66   Ht '5\' 7"'$  (1.702 m)   Wt 204 lb (92.5 kg)   SpO2 98%   BMI 31.95 kg/m?  ?Wt Readings from Last 3 Encounters:  ?10/22/21 204 lb (92.5 kg)  ?10/19/20 195 lb (88.5 kg)  ?06/24/20 193 lb 8 oz (87.8 kg)  ? ? ? ?Health Maintenance Due  ?Topic Date Due  ? COVID-19 Vaccine (1) Never done  ? COLONOSCOPY (Pts 45-50yr Insurance coverage will need to be confirmed)  Never done  ? ? ?There are no preventive care reminders to display for this patient. ? ?Lab Results  ?Component Value Date  ? TSH 4.310 06/24/2020  ? ?Lab Results  ?Component Value Date  ? WBC 6.4 06/24/2020  ? HGB 15.9 06/24/2020  ? HCT 47.3 06/24/2020  ? MCV 88 06/24/2020  ? PLT 203 06/24/2020  ? ?Lab Results  ?Component Value Date  ? NA 140 06/24/2020  ? K 4.3 06/24/2020  ? CO2 24 06/24/2020  ? GLUCOSE 93 06/24/2020  ? BUN 17 06/24/2020  ? CREATININE 1.20 06/24/2020  ? BILITOT 0.8 10/19/2020  ? ALKPHOS 65 10/19/2020  ? AST 25 10/19/2020  ? ALT 43 10/19/2020  ? PROT 7.6 10/19/2020  ? ALBUMIN 5.1 (H) 10/19/2020  ? CALCIUM 9.9 06/24/2020  ? ?Lab Results  ?Component Value Date  ? CHOL 233 (H) 10/19/2020  ? ?Lab Results  ?Component Value Date  ? HDL 30 (L) 10/19/2020  ? ?Lab Results  ?Component Value Date  ? LDLCALC 142 (H)  10/19/2020  ? ?Lab Results  ?Component Value Date  ? TRIG 333 (H) 10/19/2020  ? ?Lab Results  ?Component Value Date  ? CHOLHDL 7.8 (H) 07/01/2019  ? ?Lab Results  ?Component Value Date  ? HGBA1C 5.3 07/01/2019  ? ? ?  ?Assessment & Plan:  ? ?Problem List Items Addressed This Visit   ? ?  ? Nervous and Auditory  ? Neuropathy of finger, left  ?  Unclear etiology ?For now we will monitor and not work up or medicate ?Advised could try Voltaren OTC for pain relief ?  ?  ?  ? Other  ? Elevated blood-pressure reading, without diagnosis of hypertension - Primary  ?  Pt reports elevated at home as well. ?Hesitant to meds ?Discussion on ace-I, why controlled BP is important long-term. Pt will work on weight loss and diet and check at home more consistently  ?F/u 8 weeks ? ?  ?  ? Hypercholesterolemia  ?  Currently managed with atorvastatin 20 mg ?Will check lipid panel/cmp ?  ?  ? Relevant Orders  ? Lipid panel  ? Comprehensive Metabolic Panel (CMET)  ? ?Other Visit Diagnoses   ? ? BMI 31.0-31.9,adult      ? Relevant Orders  ? HgB A1c  ? ?  ? Will return and do bloodwork fasting. ? ?Follow-up: Return in about 8 weeks (around 12/17/2021) for hypertension.  ? ? ?I, Mikey Kirschner, PA-C have reviewed all documentation for this visit. The documentation on  '@CurDate'$ @  for the exam, diagnosis, procedures, and orders are all accurate and complete. ? ?Mikey Kirschner, PA-C ?[facility] ?[id number] ?[place], [place], [address] ?Office: [phone] ?Fax: [phone]  ?

## 2021-10-22 NOTE — Assessment & Plan Note (Signed)
Currently managed with atorvastatin 20 mg ?Will check lipid panel/cmp ?

## 2021-10-22 NOTE — Assessment & Plan Note (Signed)
Unclear etiology ?For now we will monitor and not work up or medicate ?Advised could try Voltaren OTC for pain relief ?

## 2021-10-22 NOTE — Assessment & Plan Note (Signed)
Pt reports elevated at home as well. ?Hesitant to meds ?Discussion on ace-I, why controlled BP is important long-term. Pt will work on weight loss and diet and check at home more consistently  ?F/u 8 weeks ? ?

## 2021-10-27 DIAGNOSIS — E7800 Pure hypercholesterolemia, unspecified: Secondary | ICD-10-CM | POA: Diagnosis not present

## 2021-10-28 ENCOUNTER — Telehealth: Payer: Self-pay

## 2021-10-28 LAB — COMPREHENSIVE METABOLIC PANEL
ALT: 52 IU/L — ABNORMAL HIGH (ref 0–44)
AST: 31 IU/L (ref 0–40)
Albumin/Globulin Ratio: 1.7 (ref 1.2–2.2)
Albumin: 4.6 g/dL (ref 4.0–5.0)
Alkaline Phosphatase: 78 IU/L (ref 44–121)
BUN/Creatinine Ratio: 12 (ref 9–20)
BUN: 15 mg/dL (ref 6–24)
Bilirubin Total: 0.4 mg/dL (ref 0.0–1.2)
CO2: 23 mmol/L (ref 20–29)
Calcium: 10 mg/dL (ref 8.7–10.2)
Chloride: 103 mmol/L (ref 96–106)
Creatinine, Ser: 1.26 mg/dL (ref 0.76–1.27)
Globulin, Total: 2.7 g/dL (ref 1.5–4.5)
Glucose: 100 mg/dL — ABNORMAL HIGH (ref 70–99)
Potassium: 4.3 mmol/L (ref 3.5–5.2)
Sodium: 142 mmol/L (ref 134–144)
Total Protein: 7.3 g/dL (ref 6.0–8.5)
eGFR: 69 mL/min/{1.73_m2} (ref >59)

## 2021-10-28 LAB — HEMOGLOBIN A1C
Est. average glucose Bld gHb Est-mCnc: 111 mg/dL
Hgb A1c MFr Bld: 5.5 % (ref 4.8–5.6)

## 2021-10-28 LAB — LIPID PANEL
Chol/HDL Ratio: 5.1 ratio — ABNORMAL HIGH (ref 0.0–5.0)
Cholesterol, Total: 163 mg/dL (ref 100–199)
HDL: 32 mg/dL — ABNORMAL LOW (ref >39)
LDL Chol Calc (NIH): 94 mg/dL (ref 0–99)
Triglycerides: 217 mg/dL — ABNORMAL HIGH (ref 0–149)
VLDL Cholesterol Cal: 37 mg/dL (ref 5–40)

## 2021-10-28 NOTE — Telephone Encounter (Signed)
Copied from [place] [id number]. Topic: General - Other ?>> Oct 28, 2021  1:34 PM Tessa Lerner A wrote: ?Reason for CRM: The patient's wife has called requesting additional orders for the patient to have their PSA checked ? ?Tomorrow will be the patient's last day off of work with flexibility to come into the office ? ?Please contact further when possible ?

## 2021-11-02 ENCOUNTER — Other Ambulatory Visit: Payer: Self-pay | Admitting: Physician Assistant

## 2021-11-02 DIAGNOSIS — Z125 Encounter for screening for malignant neoplasm of prostate: Secondary | ICD-10-CM

## 2021-11-02 NOTE — Progress Notes (Signed)
Requested prostate cancer screening ?

## 2021-11-16 ENCOUNTER — Other Ambulatory Visit: Payer: Self-pay | Admitting: Family Medicine

## 2021-11-16 DIAGNOSIS — E7800 Pure hypercholesterolemia, unspecified: Secondary | ICD-10-CM

## 2021-12-16 NOTE — Progress Notes (Unsigned)
      I,Sha'taria Tyson,acting as a Education administrator for [employer], PA-C.,have documented all relevant documentation on the behalf of Mikey Kirschner, PA-C,as directed by  Mikey Kirschner, PA-C while in the presence of Mikey Kirschner, PA-C.  Established patient visit   Patient: Samuel Tyler.   DOB: April 30, 1971   51 y.o. Male  MRN: [id number] Visit Date: 12/17/2021  Today's healthcare provider: Mikey Kirschner, PA-C   No chief complaint on file.  Subjective    HPI  Hypertension, follow-up  BP Readings from Last 3 Encounters:  10/22/21 (!) 149/99  10/19/20 (!) 147/96  06/24/20 (!) 148/92   Wt Readings from Last 3 Encounters:  10/22/21 204 lb (92.5 kg)  10/19/20 195 lb (88.5 kg)  06/24/20 193 lb 8 oz (87.8 kg)     He was last seen for hypertension 8 weeks ago.  BP at that visit was 149/99. Management since that visit includes work on weight loss and diet and check at home more consistently.  He reports {excellent/good/fair/poor:19665} compliance with treatment. He {is/is not:9024} having side effects. {document side effects if present:1} He is following a {diet:21022986} diet. He {is/is not:9024} exercising. He {does/does not:200015} smoke.  Use of agents associated with hypertension: {bp agents assoc with hypertension:511::"none"}.   Outside blood pressures are {***enter patient reported home BP readings, or 'not being checked':1}. Symptoms: {Yes/No:20286} chest pain {Yes/No:20286} chest pressure  {Yes/No:20286} palpitations {Yes/No:20286} syncope  {Yes/No:20286} dyspnea {Yes/No:20286} orthopnea  {Yes/No:20286} paroxysmal nocturnal dyspnea {Yes/No:20286} lower extremity edema   Pertinent labs Lab Results  Component Value Date   CHOL 163 10/27/2021   HDL 32 (L) 10/27/2021   LDLCALC 94 10/27/2021   TRIG 217 (H) 10/27/2021   CHOLHDL 5.1 (H) 10/27/2021   Lab Results  Component Value Date   NA 142 10/27/2021   K 4.3 10/27/2021   CREATININE 1.26 10/27/2021   EGFR 69  10/27/2021   GLUCOSE 100 (H) 10/27/2021   TSH 4.310 06/24/2020     The 10-year ASCVD risk score (Arnett DK, et al., 2019) is: 5.4%  ---------------------------------------------------------------------------------------------------   Medications: Outpatient Medications Prior to Visit  Medication Sig   atorvastatin (LIPITOR) 20 MG tablet TAKE 1 TABLET BY MOUTH EVERY DAY   No facility-administered medications prior to visit.    Review of Systems  {Labs  Heme  Chem  Endocrine  Serology  Results Review (optional):23779}   Objective    There were no vitals taken for this visit. {Show previous vital signs (optional):23777}  Physical Exam  ***  No results found for any visits on 12/17/21.  Assessment & Plan     ***  No follow-ups on file.      {provider attestation***:1}   Mikey Kirschner, PA-C  [facility] [phone] (phone) [phone] (fax)  [place]

## 2021-12-17 ENCOUNTER — Ambulatory Visit (INDEPENDENT_AMBULATORY_CARE_PROVIDER_SITE_OTHER): Payer: BC Managed Care – PPO | Admitting: Physician Assistant

## 2021-12-17 ENCOUNTER — Encounter: Payer: Self-pay | Admitting: Physician Assistant

## 2021-12-17 VITALS — BP 146/96 | HR 71 | Ht 67.0 in | Wt 204.8 lb

## 2021-12-17 DIAGNOSIS — S161XXA Strain of muscle, fascia and tendon at neck level, initial encounter: Secondary | ICD-10-CM | POA: Diagnosis not present

## 2021-12-17 DIAGNOSIS — Z125 Encounter for screening for malignant neoplasm of prostate: Secondary | ICD-10-CM | POA: Diagnosis not present

## 2021-12-17 DIAGNOSIS — I10 Essential (primary) hypertension: Secondary | ICD-10-CM | POA: Diagnosis not present

## 2021-12-17 MED ORDER — LISINOPRIL 10 MG PO TABS: 10.0000 mg | ORAL_TABLET | Freq: Every day | ORAL | 3 refills | Status: DC

## 2021-12-17 NOTE — Assessment & Plan Note (Signed)
Consistently elevated. Will start lisinopril 10 mg daily Advised pt continue checking bp at home F/u 6 weeks

## 2021-12-17 NOTE — Patient Instructions (Signed)
Recombinant Zoster (Shingles) Vaccine: What You Need to Know 1. Why get vaccinated? Recombinant zoster (shingles) vaccine can prevent shingles. Shingles (also called herpes zoster, or just zoster) is a painful skin rash, usually with blisters. In addition to the rash, shingles can cause fever, headache, chills, or upset stomach. Rarely, shingles can lead to complications such as pneumonia, hearing problems, blindness, brain inflammation (encephalitis), or death. The risk of shingles increases with age. The most common complication of shingles is long-term nerve pain called postherpetic neuralgia (PHN). PHN occurs in the areas where the shingles rash was and can last for months or years after the rash goes away. The pain from PHN can be severe and debilitating. The risk of PHN increases with age. An older adult with shingles is more likely to develop PHN and have longer lasting and more severe pain than a younger person. People with weakened immune systems also have a higher risk of getting shingles and complications from the disease. Shingles is caused by varicella-zoster virus, the same virus that causes chickenpox. After you have chickenpox, the virus stays in your body and can cause shingles later in life. Shingles cannot be passed from one person to another, but the virus that causes shingles can spread and cause chickenpox in someone who has never had chickenpox or has never received chickenpox vaccine. 2. Recombinant shingles vaccine Recombinant shingles vaccine provides strong protection against shingles. By preventing shingles, recombinant shingles vaccine also protects against PHN and other complications. Recombinant shingles vaccine is recommended for: Adults 68 years and older Adults 19 years and older who have a weakened immune system because of disease or treatments Shingles vaccine is given as a two-dose series. For most people, the second dose should be given 2 to 6 months after the first  dose. Some people who have or will have a weakened immune system can get the second dose 1 to 2 months after the first dose. Ask your health care provider for guidance. People who have had shingles in the past and people who have received varicella (chickenpox) vaccine are recommended to get recombinant shingles vaccine. The vaccine is also recommended for people who have already gotten another type of shingles vaccine, the live shingles vaccine. There is no live virus in recombinant shingles vaccine. Shingles vaccine may be given at the same time as other vaccines. 3. Talk with your health care provider Tell your vaccination provider if the person getting the vaccine: Has had an allergic reaction after a previous dose of recombinant shingles vaccine, or has any severe, life-threatening allergies Is currently experiencing an episode of shingles Is pregnant In some cases, your health care provider may decide to postpone shingles vaccination until a future visit. People with minor illnesses, such as a cold, may be vaccinated. People who are moderately or severely ill should usually wait until they recover before getting recombinant shingles vaccine. Your health care provider can give you more information. 4. Risks of a vaccine reaction A sore arm with mild or moderate pain is very common after recombinant shingles vaccine. Redness and swelling can also happen at the site of the injection. Tiredness, muscle pain, headache, shivering, fever, stomach pain, and nausea are common after recombinant shingles vaccine. These side effects may temporarily prevent a vaccinated person from doing regular activities. Symptoms usually go away on their own in 2 to 3 days. You should still get the second dose of recombinant shingles vaccine even if you had one of these reactions after the first dose. Guillain-Barr  syndrome (GBS), a serious nervous system disorder, has been reported very rarely after recombinant zoster  vaccine. People sometimes faint after medical procedures, including vaccination. Tell your provider if you feel dizzy or have vision changes or ringing in the ears. As with any medicine, there is a very remote chance of a vaccine causing a severe allergic reaction, other serious injury, or death. 5. What if there is a serious problem? An allergic reaction could occur after the vaccinated person leaves the clinic. If you see signs of a severe allergic reaction (hives, swelling of the face and throat, difficulty breathing, a fast heartbeat, dizziness, or weakness), call 9-1-1 and get the person to the nearest hospital. For other signs that concern you, call your health care provider. Adverse reactions should be reported to the Vaccine Adverse Event Reporting System (VAERS). Your health care provider will usually file this report, or you can do it yourself. Visit the VAERS website at www.vaers.hhs.gov or call 1-800-822-7967. VAERS is only for reporting reactions, and VAERS staff members do not give medical advice. 6. How can I learn more? Ask your health care provider. Call your local or state health department. Visit the website of the Food and Drug Administration (FDA) for vaccine package inserts and additional information at www.fda.gov/vaccinesblood-biologics/vaccines. Contact the Centers for Disease Control and Prevention (CDC): Call 1-800-232-4636 (1-800-CDC-INFO) or Visit CDC's website at www.cdc.gov/vaccines. Source: CDC Vaccine Information Statement Recombinant Zoster Vaccine (08/21/2020) This same material is available at www.cdc.gov for no charge. This information is not intended to replace advice given to you by your health care provider. Make sure you discuss any questions you have with your health care provider. Document Revised: 06/02/2021 Document Reviewed: 09/04/2020 Elsevier Patient Education  2023 Elsevier Inc.  

## 2021-12-17 NOTE — Assessment & Plan Note (Signed)
Improving Advised heat/ice, nsaids, stretching, massage

## 2021-12-18 LAB — PSA: Prostate Specific Ag, Serum: 2.8 ng/mL (ref 0.0–4.0)

## 2022-01-28 ENCOUNTER — Ambulatory Visit: Payer: BC Managed Care – PPO | Admitting: Physician Assistant

## 2022-01-31 NOTE — Progress Notes (Unsigned)
I,Sha'taria Tyson,acting as a Education administrator for [employer], PA-C.,have documented all relevant documentation on the behalf of Mikey Kirschner, PA-C,as directed by  Mikey Kirschner, PA-C while in the presence of Mikey Kirschner, PA-C.   Established patient visit   Patient: Samuel Tyler.   DOB: [birth year]   51 y.o. Male  MRN: [id number] Visit Date: 02/01/2022  Today's healthcare provider: Mikey Kirschner, PA-C   Cc. Htn f/u  Subjective    HPI  Hypertension, follow-up  BP Readings from Last 3 Encounters:  02/01/22 (!) 145/88  12/17/21 (!) 146/96  10/22/21 (!) 149/99   Wt Readings from Last 3 Encounters:  02/01/22 201 lb 14.4 oz (91.6 kg)  12/17/21 204 lb 12.8 oz (92.9 kg)  10/22/21 204 lb (92.5 kg)     He was last seen for hypertension 6 weeks ago.  BP at that visit was 146/96. Management since that visit includes start lisinopril 10 mg daily.  He reports excellent compliance with treatment. He is not having side effects. He is following a Regular diet. He is not exercising. He does not smoke.  Use of agents associated with hypertension: none.   Outside blood pressures are 125-150/80s-100. Symptoms: No chest pain No chest pressure  No palpitations No syncope  No dyspnea No orthopnea  No paroxysmal nocturnal dyspnea Yes lower extremity edema   Pertinent labs Lab Results  Component Value Date   CHOL 163 10/27/2021   HDL 32 (L) 10/27/2021   LDLCALC 94 10/27/2021   TRIG 217 (H) 10/27/2021   CHOLHDL 5.1 (H) 10/27/2021   Lab Results  Component Value Date   NA 142 10/27/2021   K 4.3 10/27/2021   CREATININE 1.26 10/27/2021   EGFR 69 10/27/2021   GLUCOSE 100 (H) 10/27/2021   TSH 4.310 06/24/2020     The 10-year ASCVD risk score (Arnett DK, et al., 2019) is: 6%  ---------------------------------------------------------------------------------------------------   Medications: Outpatient Medications Prior to Visit  Medication Sig   atorvastatin (LIPITOR) 20  MG tablet TAKE 1 TABLET BY MOUTH EVERY DAY   [DISCONTINUED] lisinopril (ZESTRIL) 10 MG tablet Take 1 tablet (10 mg total) by mouth daily.   No facility-administered medications prior to visit.    Review of Systems  Constitutional:  Negative for fatigue and fever.  Respiratory:  Negative for cough and shortness of breath.   Cardiovascular:  Negative for chest pain, palpitations and leg swelling.  Neurological:  Negative for dizziness and headaches.      Objective    Blood pressure (!) 145/88, pulse 78, height '5\' 7"'  (1.702 m), weight 201 lb 14.4 oz (91.6 kg), SpO2 99 %.   Physical Exam Constitutional:      General: He is awake.     Appearance: He is well-developed.  HENT:     Head: Normocephalic.  Eyes:     Conjunctiva/sclera: Conjunctivae normal.  Cardiovascular:     Rate and Rhythm: Normal rate and regular rhythm.     Heart sounds: Normal heart sounds.  Pulmonary:     Effort: Pulmonary effort is normal.     Breath sounds: Normal breath sounds.  Skin:    General: Skin is warm.  Neurological:     Mental Status: He is alert and oriented to person, place, and time.  Psychiatric:        Attention and Perception: Attention normal.        Mood and Affect: Mood normal.        Speech: Speech normal.  Behavior: Behavior is cooperative.     No results found for any visits on 02/01/22.  Assessment & Plan     Problem List Items Addressed This Visit       Cardiovascular and Mediastinum   Primary hypertension - Primary    Still elevated in office, average at home elevated as well Increase lisinopril to 20 mg  Reviewed last cmp  F/u 8 weeks per pt schedule, advised to send my chart message if not in range, goal 120s/70s-80s      Relevant Medications   lisinopril (ZESTRIL) 20 MG tablet     Return in about 8 weeks (around 03/29/2022) for hypertension.      I, Mikey Kirschner, PA-C have reviewed all documentation for this visit. The documentation on  02/01/2022 for the  exam, diagnosis, procedures, and orders are all accurate and complete.  Mikey Kirschner, PA-C [facility] [address] [place], [place], [address] Office: [phone] Fax: [place]

## 2022-02-01 ENCOUNTER — Encounter: Payer: Self-pay | Admitting: Physician Assistant

## 2022-02-01 ENCOUNTER — Ambulatory Visit (INDEPENDENT_AMBULATORY_CARE_PROVIDER_SITE_OTHER): Payer: BC Managed Care – PPO | Admitting: Physician Assistant

## 2022-02-01 VITALS — BP 145/88 | HR 78 | Ht 67.0 in | Wt 201.9 lb

## 2022-02-01 DIAGNOSIS — I10 Essential (primary) hypertension: Secondary | ICD-10-CM | POA: Diagnosis not present

## 2022-02-01 MED ORDER — LISINOPRIL 20 MG PO TABS: 20.0000 mg | ORAL_TABLET | Freq: Every day | ORAL | 3 refills | Status: DC

## 2022-02-01 NOTE — Assessment & Plan Note (Signed)
Still elevated in office, average at home elevated as well Increase lisinopril to 20 mg  Reviewed last cmp  F/u 8 weeks per pt schedule, advised to send my chart message if not in range, goal 120s/70s-80s

## 2022-02-12 ENCOUNTER — Other Ambulatory Visit: Payer: Self-pay | Admitting: Family Medicine

## 2022-02-12 DIAGNOSIS — E7800 Pure hypercholesterolemia, unspecified: Secondary | ICD-10-CM

## 2022-02-14 NOTE — Telephone Encounter (Signed)
Requested Prescriptions  Pending Prescriptions Disp Refills  . atorvastatin (LIPITOR) 20 MG tablet [Pharmacy Med Name: ATORVASTATIN 20 MG TABLET] 90 tablet 0    Sig: TAKE 1 TABLET BY MOUTH EVERY DAY     Cardiovascular:  Antilipid - Statins Failed - 02/12/2022  1:21 AM      Failed - Lipid Panel in normal range within the last 12 months    Cholesterol, Total  Date Value Ref Range Status  10/27/2021 163 100 - 199 mg/dL Final   LDL Chol Calc (NIH)  Date Value Ref Range Status  10/27/2021 94 0 - 99 mg/dL Final   HDL  Date Value Ref Range Status  10/27/2021 32 (L) >39 mg/dL Final   Triglycerides  Date Value Ref Range Status  10/27/2021 217 (H) 0 - 149 mg/dL Final         Passed - Patient is not pregnant      Passed - Valid encounter within last 12 months    Recent Outpatient Visits          1 week ago Primary hypertension   [employer], [place], PA-C   1 month ago Primary hypertension   [employer], [place], PA-C   3 months ago Elevated blood-pressure reading, without diagnosis of hypertension   [employer], [place], PA-C   1 year ago Umbilical hernia without obstruction and without gangrene   [facility], Clearnce Sorrel, [place]   1 year ago Situational anxiety   [facility], Clearnce Sorrel, [place]      Future Appointments            In 1 month Thedore Mins, Ria Comment, PA-C [facility], [place]

## 2022-04-05 ENCOUNTER — Ambulatory Visit (INDEPENDENT_AMBULATORY_CARE_PROVIDER_SITE_OTHER): Payer: BC Managed Care – PPO | Admitting: Physician Assistant

## 2022-04-05 ENCOUNTER — Encounter: Payer: Self-pay | Admitting: Physician Assistant

## 2022-04-05 VITALS — BP 119/74 | HR 74 | Ht 67.0 in | Wt 198.3 lb

## 2022-04-05 DIAGNOSIS — I10 Essential (primary) hypertension: Secondary | ICD-10-CM

## 2022-04-05 DIAGNOSIS — Z23 Encounter for immunization: Secondary | ICD-10-CM

## 2022-04-05 NOTE — Progress Notes (Signed)
I,Sha'taria Tyson,acting as a Education administrator for [employer], PA-C.,have documented all relevant documentation on the behalf of Mikey Kirschner, PA-C,as directed by  Mikey Kirschner, PA-C while in the presence of Mikey Kirschner, PA-C.   Established patient visit   Patient: Samuel Tyler.   DOB: Nov 11, 1970   51 y.o. Male  MRN: [id number] Visit Date: 04/05/2022  Today's healthcare provider: Mikey Kirschner, PA-C   Cc. Htn f/u  Subjective    HPI   Hypertension, follow-up  BP Readings from Last 3 Encounters:  04/05/22 119/74  02/01/22 (!) 145/88  12/17/21 (!) 146/96   Wt Readings from Last 3 Encounters:  04/05/22 198 lb 4.8 oz (89.9 kg)  02/01/22 201 lb 14.4 oz (91.6 kg)  12/17/21 204 lb 12.8 oz (92.9 kg)     He was last seen for hypertension 2 months ago.  BP at that visit was 145/88. Management since that visit includes increase lisinopril to 20 mg. Advised to send my chart message if not in range, goal 120s/70s-80s  He reports excellent compliance with treatment. He is not having side effects. He is following a Regular diet. He is not exercising. He does not smoke.  Use of agents associated with hypertension: none.   Outside blood pressures are mid 130's/mid 80's Symptoms: No chest pain No chest pressure  No palpitations No syncope  No dyspnea No orthopnea  No paroxysmal nocturnal dyspnea No lower extremity edema   Pertinent labs Lab Results  Component Value Date   CHOL 163 10/27/2021   HDL 32 (L) 10/27/2021   LDLCALC 94 10/27/2021   TRIG 217 (H) 10/27/2021   CHOLHDL 5.1 (H) 10/27/2021   Lab Results  Component Value Date   NA 142 10/27/2021   K 4.3 10/27/2021   CREATININE 1.26 10/27/2021   EGFR 69 10/27/2021   GLUCOSE 100 (H) 10/27/2021   TSH 4.310 06/24/2020     The 10-year ASCVD risk score (Arnett DK, et al., 2019) is: 4.3%  ---------------------------------------------------------------------------------------------------    Medications: Outpatient Medications Prior to Visit  Medication Sig   atorvastatin (LIPITOR) 20 MG tablet TAKE 1 TABLET BY MOUTH EVERY DAY   lisinopril (ZESTRIL) 20 MG tablet Take 1 tablet (20 mg total) by mouth daily.   No facility-administered medications prior to visit.    Review of Systems  Constitutional:  Negative for fatigue and fever.  Respiratory:  Negative for cough and shortness of breath.   Cardiovascular:  Negative for chest pain, palpitations and leg swelling.  Neurological:  Negative for dizziness and headaches.       Objective    Blood pressure 119/74, pulse 74, height '5\' 7"'  (1.702 m), weight 198 lb 4.8 oz (89.9 kg), SpO2 100 %.   Physical Exam Constitutional:      General: He is awake.     Appearance: He is well-developed.  HENT:     Head: Normocephalic.  Eyes:     Conjunctiva/sclera: Conjunctivae normal.  Cardiovascular:     Rate and Rhythm: Normal rate and regular rhythm.     Heart sounds: Normal heart sounds.  Pulmonary:     Effort: Pulmonary effort is normal.     Breath sounds: Normal breath sounds.  Skin:    General: Skin is warm.  Neurological:     Mental Status: He is alert and oriented to person, place, and time.  Psychiatric:        Attention and Perception: Attention normal.        Mood and Affect:  Mood normal.        Speech: Speech normal.        Behavior: Behavior is cooperative.      No results found for any visits on 04/05/22.  Assessment & Plan     Problem List Items Addressed This Visit       Cardiovascular and Mediastinum   Primary hypertension - Primary    Last visit increased to lisinopril 20 mg  Now well controlled in office and at home Reviewed last cmp F/u 6 mo      Other Visit Diagnoses     Need for shingles vaccine       Relevant Orders   Zoster Recombinant (Shingrix ) (Completed)   Flu vaccine need       Relevant Orders   Flu Vaccine QUAD 6+ mos PF IM (Fluarix Quad PF) (Completed)        Return in  about 6 months (around 10/04/2022) for CPE.     I, Mikey Kirschner, PA-C have reviewed all documentation for this visit. The documentation on  04/05/2022 for the exam, diagnosis, procedures, and orders are all accurate and complete.  Mikey Kirschner, PA-C [facility] [address] [place], [place], [address] Office: [phone] Fax: [place]

## 2022-04-05 NOTE — Assessment & Plan Note (Addendum)
Last visit increased to lisinopril 20 mg  Now well controlled in office and at home Reviewed last cmp F/u 6 mo

## 2022-05-13 ENCOUNTER — Other Ambulatory Visit: Payer: Self-pay | Admitting: Family Medicine

## 2022-05-13 DIAGNOSIS — E7800 Pure hypercholesterolemia, unspecified: Secondary | ICD-10-CM

## 2022-08-12 ENCOUNTER — Other Ambulatory Visit: Payer: Self-pay | Admitting: Physician Assistant

## 2022-08-12 DIAGNOSIS — E7800 Pure hypercholesterolemia, unspecified: Secondary | ICD-10-CM

## 2022-10-12 ENCOUNTER — Ambulatory Visit (INDEPENDENT_AMBULATORY_CARE_PROVIDER_SITE_OTHER): Payer: BC Managed Care – PPO | Admitting: Physician Assistant

## 2022-10-12 ENCOUNTER — Encounter: Payer: Self-pay | Admitting: Physician Assistant

## 2022-10-12 VITALS — BP 129/87 | HR 71 | Temp 98.0°F | Ht 67.0 in | Wt 199.6 lb

## 2022-10-12 DIAGNOSIS — I10 Essential (primary) hypertension: Secondary | ICD-10-CM

## 2022-10-12 DIAGNOSIS — Z23 Encounter for immunization: Secondary | ICD-10-CM

## 2022-10-12 DIAGNOSIS — R739 Hyperglycemia, unspecified: Secondary | ICD-10-CM | POA: Diagnosis not present

## 2022-10-12 DIAGNOSIS — Z7722 Contact with and (suspected) exposure to environmental tobacco smoke (acute) (chronic): Secondary | ICD-10-CM

## 2022-10-12 DIAGNOSIS — Z0000 Encounter for general adult medical examination without abnormal findings: Secondary | ICD-10-CM

## 2022-10-12 DIAGNOSIS — E7800 Pure hypercholesterolemia, unspecified: Secondary | ICD-10-CM

## 2022-10-12 NOTE — Progress Notes (Signed)
I,J'ya E Hunter,acting as a scribe for [employer], PA-C.,have documented all relevant documentation on the behalf of Samuel Kirschner, PA-C,as directed by  Samuel Kirschner, PA-C while in the presence of Samuel Kirschner, PA-C.   Complete physical exam   Patient: Samuel Tyler.   DOB: 1971-01-15   52 y.o. Male  MRN: [id number] Visit Date: 10/12/2022  Today's healthcare provider: Mikey Kirschner, PA-C   Cc. cpe  Subjective    Samuel Tyler. is a 52 y.o. male who presents today for a complete physical exam.  He reports consuming a general diet.  Pt reports that he plays disk golf a couple of times a week.  He generally feels well. He reports sleeping fairly well. He does not have additional problems to discuss today.    Past Medical History:  Diagnosis Date   Anxiety    Hyperlipidemia    Past Surgical History:  Procedure Laterality Date   HERNIA REPAIR     WISDOM TOOTH EXTRACTION     Social History   Socioeconomic History   Marital status: Married    Spouse name: Not on file   Number of children: Not on file   Years of education: Not on file   Highest education level: Not on file  Occupational History   Not on file  Tobacco Use   Smoking status: Never    Passive exposure: Yes   Smokeless tobacco: Never  Substance and Sexual Activity   Alcohol use: Yes   Drug use: No   Sexual activity: Yes    Partners: Female    Birth control/protection: None  Other Topics Concern   Not on file  Social History Narrative   Not on file   Social Determinants of Health   Financial Resource Strain: Not on file  Food Insecurity: Not on file  Transportation Needs: Not on file  Physical Activity: Not on file  Stress: Not on file  Social Connections: Not on file  Intimate Partner Violence: Not on file   Family Status  Relation Name Status   Mother  Alive   Sister  Alive   Brother  Alive   Daughter  Alive   Son  [place]   Brother  Alive   Daughter  Alive   Family  History  Problem Relation Age of Onset   Hypertension Mother    No Known Allergies  Patient Care Team: Samuel Kirschner, PA-C as PCP - General (Physician Assistant) Annamaria Helling, DO as Consulting Physician (Gastroenterology)   Medications: Outpatient Medications Prior to Visit  Medication Sig   atorvastatin (LIPITOR) 20 MG tablet TAKE 1 TABLET BY MOUTH EVERY DAY   lisinopril (ZESTRIL) 20 MG tablet Take 1 tablet (20 mg total) by mouth daily.   No facility-administered medications prior to visit.    Review of Systems  Constitutional:  Negative for fatigue and fever.  Respiratory:  Negative for cough and shortness of breath.   Cardiovascular:  Negative for chest pain, palpitations and leg swelling.  Genitourinary:  Positive for frequency.  Neurological:  Negative for dizziness and headaches.     Objective    BP 129/87 (BP Location: Left Arm, Patient Position: Sitting, Cuff Size: Large)   Pulse 71   Temp 98 F (36.7 C) (Oral)   Ht 5\' 7"  (1.702 m)   Wt 199 lb 9.6 oz (90.5 kg)   SpO2 100%   BMI 31.26 kg/m     Physical Exam Constitutional:  General: He is awake.     Appearance: He is well-developed.  HENT:     Head: Normocephalic.     Right Ear: Tympanic membrane, ear canal and external ear normal.     Left Ear: Tympanic membrane, ear canal and external ear normal.     Nose: Nose normal. No congestion or rhinorrhea.     Mouth/Throat:     Mouth: Mucous membranes are moist.     Pharynx: No oropharyngeal exudate or posterior oropharyngeal erythema.  Eyes:     Pupils: Pupils are equal, round, and reactive to light.  Cardiovascular:     Rate and Rhythm: Normal rate and regular rhythm.     Heart sounds: Normal heart sounds.  Pulmonary:     Effort: Pulmonary effort is normal.     Breath sounds: Normal breath sounds.  Abdominal:     General: There is no distension.     Palpations: Abdomen is soft.     Tenderness: There is no abdominal tenderness. There is  no guarding.  Musculoskeletal:     Cervical back: Normal range of motion.     Right lower leg: No edema.     Left lower leg: No edema.  Lymphadenopathy:     Cervical: No cervical adenopathy.  Skin:    General: Skin is warm.  Neurological:     Mental Status: He is alert and oriented to person, place, and time.  Psychiatric:        Attention and Perception: Attention normal.        Mood and Affect: Mood normal.        Speech: Speech normal.        Behavior: Behavior normal. Behavior is cooperative.      Last depression screening scores    10/12/2022   10:49 AM 04/05/2022    1:23 PM 10/22/2021    4:08 PM  PHQ 2/9 Scores  PHQ - 2 Score 0 0 0  PHQ- 9 Score 1 2 0   Last fall risk screening    10/12/2022   10:49 AM  Fall Risk   Falls in the past year? 0  Number falls in past yr: 0  Injury with Fall? 0  Risk for fall due to : No Fall Risks   Last Audit-C alcohol use screening    10/12/2022   10:49 AM  Alcohol Use Disorder Test (AUDIT)  1. How often do you have a drink containing alcohol? 2  2. How many drinks containing alcohol do you have on a typical day when you are drinking? 0  3. How often do you have six or more drinks on one occasion? 0  AUDIT-C Score 2   A score of 3 or more in women, and 4 or more in men indicates increased risk for alcohol abuse, EXCEPT if all of the points are from question 1   No results found for any visits on 10/12/22.  Assessment & Plan    Routine Health Maintenance and Physical Exam  Exercise Activities and Dietary recommendations --balanced diet high in fiber and protein, low in sugars, carbs, fats. --physical activity/exercise 30 minutes 3-5 times a week     Immunization History  Administered Date(s) Administered   Influenza Inj Mdck Quad Pf 05/03/2017   Influenza, Seasonal, Injecte, Preservative Fre 05/04/2016   Influenza,inj,Quad PF,6+ Mos 04/26/2018, 04/05/2022   Influenza,inj,quad, With Preservative 04/28/2015    Influenza-Unspecified 04/29/2014, 06/27/2020   Tdap 02/25/2013   Zoster Recombinat (Shingrix) 04/05/2022, 10/12/2022    Health  Maintenance  Topic Date Due   COVID-19 Vaccine (1) Never done   DTaP/Tdap/Td (2 - Td or Tdap) 02/26/2023   COLONOSCOPY (Pts 45-49yrs Insurance coverage will need to be confirmed)  04/07/2028   INFLUENZA VACCINE  Completed   Hepatitis C Screening  Completed   HIV Screening  Completed   Zoster Vaccines- Shingrix  Completed   HPV VACCINES  Aged Out    Discussed health benefits of physical activity, and encouraged him to engage in regular exercise appropriate for his age and condition.  Problem List Items Addressed This Visit       Cardiovascular and Mediastinum   Primary hypertension    Well controlled Continue lisinopril 20 mg Ordered cmp F/u 6 mo       Relevant Orders   CBC w/Diff/Platelet   Comprehensive Metabolic Panel (CMET)     Other   Hypercholesterolemia    Managed with atorvastatin 20 mg Repeat lipids, pt not completely fasting. Last check in range.      Relevant Orders   Comprehensive Metabolic Panel (CMET)   Lipid panel   Second hand smoke exposure    Pt does not smoke but works around people daily who do. Documenting.      Hyperglycemia    Historically, repeat a1c      Relevant Orders   HgB A1c   Other Visit Diagnoses     Annual physical exam    -  Primary   Relevant Orders   CBC w/Diff/Platelet   Comprehensive Metabolic Panel (CMET)   HgB A1c   Lipid panel   Need for shingles vaccine       Relevant Orders   Zoster Recombinant (Shingrix ) (Completed)        Return in about 6 months (around 04/14/2023) for chronic conditions.     I, Samuel Kirschner, PA-C have reviewed all documentation for this visit. The documentation on  10/12/22 for the exam, diagnosis, procedures, and orders are all accurate and complete.  Samuel Kirschner, PA-C [facility] [address] [place], [place],  [address] Office: [phone] Fax: [place]

## 2022-10-12 NOTE — Assessment & Plan Note (Signed)
Well controlled  Continue lisinopril 20 mg Ordered cmp F/u 6 mo 

## 2022-10-12 NOTE — Assessment & Plan Note (Signed)
Historically, repeat a1c

## 2022-10-12 NOTE — Assessment & Plan Note (Signed)
Managed with atorvastatin 20 mg Repeat lipids, pt not completely fasting. Last check in range.

## 2022-10-12 NOTE — Assessment & Plan Note (Signed)
Pt does not smoke but works around people daily who do. Documenting.

## 2022-10-13 LAB — CBC WITH DIFFERENTIAL/PLATELET
Basophils Absolute: 0.1 10*3/uL (ref 0.0–0.2)
Basos: 1 %
EOS (ABSOLUTE): 0.1 10*3/uL (ref 0.0–0.4)
Eos: 2 %
Hematocrit: 49.8 % (ref 37.5–51.0)
Hemoglobin: 16.8 g/dL (ref 13.0–17.7)
Immature Grans (Abs): 0 10*3/uL (ref 0.0–0.1)
Immature Granulocytes: 1 %
Lymphocytes Absolute: 1.9 10*3/uL (ref 0.7–3.1)
Lymphs: 29 %
MCH: 29.7 pg (ref 26.6–33.0)
MCHC: 33.7 g/dL (ref 31.5–35.7)
MCV: 88 fL (ref 79–97)
Monocytes Absolute: 0.5 10*3/uL (ref 0.1–0.9)
Monocytes: 8 %
Neutrophils Absolute: 3.9 10*3/uL (ref 1.4–7.0)
Neutrophils: 59 %
Platelets: 229 10*3/uL (ref 150–450)
RBC: 5.66 x10E6/uL (ref 4.14–5.80)
RDW: 12.5 % (ref 11.6–15.4)
WBC: 6.6 10*3/uL (ref 3.4–10.8)

## 2022-10-13 LAB — LIPID PANEL
Chol/HDL Ratio: 5.1 ratio — ABNORMAL HIGH (ref 0.0–5.0)
Cholesterol, Total: 179 mg/dL (ref 100–199)
HDL: 35 mg/dL — ABNORMAL LOW (ref >39)
LDL Chol Calc (NIH): 101 mg/dL — ABNORMAL HIGH (ref 0–99)
Triglycerides: 252 mg/dL — ABNORMAL HIGH (ref 0–149)
VLDL Cholesterol Cal: 43 mg/dL — ABNORMAL HIGH (ref 5–40)

## 2022-10-13 LAB — COMPREHENSIVE METABOLIC PANEL
ALT: 43 IU/L (ref 0–44)
AST: 27 IU/L (ref 0–40)
Albumin/Globulin Ratio: 1.8 (ref 1.2–2.2)
Albumin: 5 g/dL (ref 4.1–5.1)
Alkaline Phosphatase: 81 IU/L (ref 44–121)
BUN/Creatinine Ratio: 12 (ref 9–20)
BUN: 13 mg/dL (ref 6–24)
Bilirubin Total: 0.6 mg/dL (ref 0.0–1.2)
CO2: 24 mmol/L (ref 20–29)
Calcium: 10.3 mg/dL — ABNORMAL HIGH (ref 8.7–10.2)
Chloride: 102 mmol/L (ref 96–106)
Creatinine, Ser: 1.09 mg/dL (ref 0.76–1.27)
Globulin, Total: 2.8 g/dL (ref 1.5–4.5)
Glucose: 95 mg/dL (ref 70–99)
Potassium: 4.9 mmol/L (ref 3.5–5.2)
Sodium: 141 mmol/L (ref 134–144)
Total Protein: 7.8 g/dL (ref 6.0–8.5)
eGFR: 83 mL/min/{1.73_m2} (ref >59)

## 2022-10-13 LAB — HEMOGLOBIN A1C
Est. average glucose Bld gHb Est-mCnc: 111 mg/dL
Hgb A1c MFr Bld: 5.5 % (ref 4.8–5.6)

## 2022-11-01 ENCOUNTER — Telehealth: Payer: BC Managed Care – PPO | Admitting: Nurse Practitioner

## 2022-11-01 DIAGNOSIS — H1032 Unspecified acute conjunctivitis, left eye: Secondary | ICD-10-CM

## 2022-11-01 MED ORDER — OFLOXACIN 0.3 % OP SOLN: 1.0000 [drp] | Freq: Four times a day (QID) | OPHTHALMIC | 0 refills | Status: AC

## 2022-11-01 NOTE — Progress Notes (Signed)
Virtual Visit Consent   Samuel Forts., you are scheduled for a virtual visit with a [facility] provider today. Just as with appointments in the office, your consent must be obtained to participate. Your consent will be active for this visit and any virtual visit you may have with one of our providers in the next 365 days. If you have a MyChart account, a copy of this consent can be sent to you electronically.  As this is a virtual visit, video technology does not allow for your provider to perform a traditional examination. This may limit your provider's ability to fully assess your condition. If your provider identifies any concerns that need to be evaluated in person or the need to arrange testing (such as labs, EKG, etc.), we will make arrangements to do so. Although advances in technology are sophisticated, we cannot ensure that it will always work on either your end or our end. If the connection with a video visit is poor, the visit may have to be switched to a telephone visit. With either a video or telephone visit, we are not always able to ensure that we have a secure connection.  By engaging in this virtual visit, you consent to the provision of healthcare and authorize for your insurance to be billed (if applicable) for the services provided during this visit. Depending on your insurance coverage, you may receive a charge related to this service.  I need to obtain your verbal consent now. Are you willing to proceed with your visit today? Samuel Forts. has provided verbal consent on 11/01/2022 for a virtual visit (video or telephone). Viviano Simas, FNP  Date: 11/01/2022 8:08 AM  Virtual Visit via Video Note   I, Viviano Simas, connected with  Samuel Forts.  ([id number], [birth year]) on 11/01/22 at  8:15 AM EDT by a video-enabled telemedicine application and verified that I am speaking with the correct person using two identifiers.  Location: Patient: Virtual Visit Location  Patient: Home Provider: Virtual Visit Location Provider: Home Office   I discussed the limitations of evaluation and management by telemedicine and the availability of in person appointments. The patient expressed understanding and agreed to proceed.    History of Present Illness: Samuel Ehle. is a 52 y.o. who identifies as a male who was assigned male at birth, and is being seen today for noted redness in his left eye and it was stuck shut.  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  Resting comfortably  at home.  Head is normocephalic, atraumatic.  No labored breathing.  Speech is clear and coherent with logical content.  Patient is alert and oriented  at baseline.  Left upper lid with slight edema, no erythema to lower orbit Conjunctiva injected (left)  No discharge or drainage noted   Assessment and Plan: 1. Acute bacterial conjunctivitis of left eye  - ofloxacin (OCUFLOX) 0.3 % ophthalmic solution; Place 1 drop into the left eye 4 (four) times daily for 5 days.  Dispense: 5 mL; Refill: 0     Follow Up Instructions: I discussed the assessment and treatment plan with the  patient. The patient was provided an opportunity to ask questions and all were answered. The patient agreed with the plan and demonstrated an understanding of the instructions.  A copy of instructions were sent to the patient via MyChart unless otherwise noted below.    The patient was advised to call back or seek an in-person evaluation if the symptoms worsen or if the condition fails to improve as anticipated.  Time:  I spent 12 minutes with the patient via telehealth technology discussing the above problems/concerns.    Viviano Simas, FNP

## 2023-02-12 ENCOUNTER — Ambulatory Visit
Admission: RE | Admit: 2023-02-12 | Discharge: 2023-02-12 | Disposition: A | Discharge status: Home / Self Care | Payer: BC Managed Care – PPO | Source: Ambulatory Visit | Attending: Emergency Medicine | Admitting: Emergency Medicine

## 2023-02-12 VITALS — BP 153/84 | HR 93 | Temp 100.3°F | Resp 18

## 2023-02-12 DIAGNOSIS — U071 COVID-19: Secondary | ICD-10-CM | POA: Insufficient documentation

## 2023-02-12 DIAGNOSIS — B349 Viral infection, unspecified: Secondary | ICD-10-CM | POA: Diagnosis not present

## 2023-02-12 NOTE — ED Provider Notes (Signed)
[name]    CSN: [id number] Arrival date & time: 02/12/23  4098      History   Chief Complaint Chief Complaint  Patient presents with   Fever    Fever, sore throat, achy, congested - Entered by patient    HPI Samuel Tyler. is a 52 y.o. male.  Companied by his wife, patient presents with fever, chills, body aches, congestion, cough x 1 day.  Tmax 102.  Treating with Tylenol and ibuprofen.  He denies chest pain, shortness of breath, vomiting, diarrhea, or other symptoms.  His medical history includes hypertension and hyperlipidemia.  The history is provided by the patient, the spouse and medical records.    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 Authorizing Provider  atorvastatin (LIPITOR) 20 MG tablet TAKE 1 TABLET BY MOUTH EVERY DAY 08/12/22   Drubel, Lillia Abed, PA-C  lisinopril (ZESTRIL) 20 MG tablet Take 1 tablet (20 mg total) by mouth daily. 02/01/22   Alfredia Ferguson, PA-C    Family History Family History  Problem Relation Age of Onset   Hypertension Mother     Social History Social History   Tobacco Use   Smoking status: Never    Passive exposure: Yes   Smokeless tobacco: Never  Substance Use Topics   Alcohol use: Yes   Drug use: No     Allergies   Patient has no known allergies.   Review of Systems Review of Systems  Constitutional:  Negative for chills and fever.  HENT:   Positive for congestion. Negative for ear pain and sore throat.   Respiratory:  Positive for cough. Negative for shortness of breath.   Cardiovascular:  Negative for chest pain and palpitations.  Gastrointestinal:  Negative for diarrhea and vomiting.     Physical Exam Triage Vital Signs ED Triage Vitals [02/12/23 1021]  Encounter Vitals Group     BP      Systolic BP Percentile      Diastolic BP Percentile      Pulse Rate 93     Resp 18     Temp 100.3 F (37.9 C)     Temp src      SpO2 94 %     Weight      Height      Head Circumference      Peak Flow      Pain Score      Pain Loc      Pain Education      Exclude from Growth Chart    No data found.  Updated Vital Signs BP (!) 153/84   Pulse 93   Temp 100.3 F (37.9 C)   Resp 18   SpO2 94%   Visual Acuity Right Eye Distance:   Left Eye Distance:   Bilateral Distance:    Right Eye Near:  Left Eye Near:    Bilateral Near:     Physical Exam Vitals and nursing note reviewed.  Constitutional:      General: He is not in acute distress.    Appearance: He is well-developed.  HENT:     Right Ear: Tympanic membrane normal.     Left Ear: Tympanic membrane normal.     Nose: Nose normal.     Mouth/Throat:     Mouth: Mucous membranes are moist.     Pharynx: Oropharynx is clear.  Cardiovascular:     Rate and Rhythm: Normal rate and regular rhythm.     Heart sounds: Normal heart sounds.  Pulmonary:     Effort: Pulmonary effort is normal. No respiratory distress.     Breath sounds: Normal breath sounds.  Musculoskeletal:     Cervical back: Neck supple.  Skin:    General: Skin is warm and dry.  Neurological:     Mental Status: He is alert.  Psychiatric:        Mood and Affect: Mood normal.        Behavior: Behavior normal.      UC Treatments / Results  Labs (all labs ordered are listed, but only abnormal results are displayed) Labs Reviewed  SARS CORONAVIRUS 2 (TAT 6-24 HRS)    EKG   Radiology No  results found.  Procedures Procedures (including critical care time)  Medications Ordered in UC Medications - No data to display  Initial Impression / Assessment and Plan / UC Course  I have reviewed the triage vital signs and the nursing notes.  Pertinent labs & imaging results that were available during my care of the patient were reviewed by me and considered in my medical decision making (see chart for details).    Viral illness.  COVID pending.  If COVID positive, patient is inclined to do symptomatic care only but will consider treatment options.  Recommend treatment with Paxlovid.  Last GFR 83 on 10/12/2022.  Hold statin if treating with Paxlovid.  Discussed symptomatic treatment including Tylenol, rest, hydration.  Instructed patient to follow up with PCP if symptoms are not improving.  He agrees to plan of care.   Final Clinical Impressions(s) / UC Diagnoses   Final diagnoses:  Viral illness     Discharge Instructions      Your COVID test is pending.    Take Tylenol as needed for fever or discomfort.  Rest and keep yourself hydrated.    Follow-up with your primary care provider if your symptoms are not improving.         ED Prescriptions   None    PDMP not reviewed this encounter.   Mickie Bail, NP 02/12/23 1048

## 2023-02-12 NOTE — Discharge Instructions (Addendum)
Your COVID test is pending.    Take Tylenol as needed for fever or discomfort.  Rest and keep yourself hydrated.    Follow-up with your primary care provider if your symptoms are not improving.     

## 2023-02-12 NOTE — ED Triage Notes (Signed)
Patient to Urgent Care with wife, complaints of fevers/ nasal congestion/ generalized body aches/ cough/ fatigue.  Symptoms started yesterday morning. Max 102. Taking tylenol. Denies any known sick contacts.

## 2023-02-13 ENCOUNTER — Other Ambulatory Visit: Payer: Self-pay | Admitting: Physician Assistant

## 2023-02-13 DIAGNOSIS — I10 Essential (primary) hypertension: Secondary | ICD-10-CM

## 2023-02-20 DIAGNOSIS — L570 Actinic keratosis: Secondary | ICD-10-CM | POA: Diagnosis not present

## 2023-02-20 DIAGNOSIS — X32XXXA Exposure to sunlight, initial encounter: Secondary | ICD-10-CM | POA: Diagnosis not present

## 2023-02-20 DIAGNOSIS — D225 Melanocytic nevi of trunk: Secondary | ICD-10-CM | POA: Diagnosis not present

## 2023-02-20 DIAGNOSIS — D2272 Melanocytic nevi of left lower limb, including hip: Secondary | ICD-10-CM | POA: Diagnosis not present

## 2023-02-20 DIAGNOSIS — D2262 Melanocytic nevi of left upper limb, including shoulder: Secondary | ICD-10-CM | POA: Diagnosis not present

## 2023-02-20 DIAGNOSIS — D2261 Melanocytic nevi of right upper limb, including shoulder: Secondary | ICD-10-CM | POA: Diagnosis not present

## 2023-03-24 ENCOUNTER — Encounter: Payer: Self-pay | Admitting: Family Medicine

## 2023-03-24 ENCOUNTER — Telehealth (INDEPENDENT_AMBULATORY_CARE_PROVIDER_SITE_OTHER): Payer: BC Managed Care – PPO | Admitting: Family Medicine

## 2023-03-24 DIAGNOSIS — I10 Essential (primary) hypertension: Secondary | ICD-10-CM | POA: Diagnosis not present

## 2023-03-24 DIAGNOSIS — R739 Hyperglycemia, unspecified: Secondary | ICD-10-CM

## 2023-03-24 DIAGNOSIS — Z7722 Contact with and (suspected) exposure to environmental tobacco smoke (acute) (chronic): Secondary | ICD-10-CM

## 2023-03-24 DIAGNOSIS — N529 Male erectile dysfunction, unspecified: Secondary | ICD-10-CM | POA: Insufficient documentation

## 2023-03-24 DIAGNOSIS — E7800 Pure hypercholesterolemia, unspecified: Secondary | ICD-10-CM

## 2023-03-24 DIAGNOSIS — R972 Elevated prostate specific antigen [PSA]: Secondary | ICD-10-CM | POA: Diagnosis not present

## 2023-03-24 MED ORDER — TADALAFIL 5 MG PO TABS: 5.0000 mg | ORAL_TABLET | Freq: Every day | ORAL | 11 refills | Status: DC

## 2023-03-24 MED ORDER — SILDENAFIL CITRATE 100 MG PO TABS: 100.0000 mg | ORAL_TABLET | ORAL | 11 refills | Status: DC | PRN

## 2023-03-24 NOTE — Progress Notes (Signed)
MyChart Video Visit  Virtual Visit via Video Note   This format is felt to be most appropriate for this patient at this time. Physical exam was limited by quality of the video and audio technology used for the visit.   Patient location: Home Provider location:  [facility] [address]  [address] [place], [place] [address]  I discussed the limitations of evaluation and management by telemedicine and the availability of in person appointments. The patient expressed understanding and agreed to proceed.  Patient: Samuel Tyler.   DOB: 20-Dec-1970   52 y.o. Male  MRN: [id number] Visit Date: 03/24/2023  Today's healthcare provider: Jacky Kindle, FNP  Introduced to nurse practitioner role and practice setting.  All questions answered.  Discussed provider/patient relationship and expectations.  Subjective    CC: ED  Medications: Outpatient Medications Prior to Visit  Medication Sig   atorvastatin (LIPITOR) 20 MG tablet TAKE 1 TABLET BY MOUTH EVERY DAY   lisinopril (ZESTRIL) 20 MG tablet TAKE 1 TABLET BY MOUTH EVERY DAY   No facility-administered medications prior to visit.    Objective    There were no vitals taken for this visit.  Physical Exam Constitutional:      Appearance: Normal appearance.  Pulmonary:     Effort: Pulmonary effort is normal.  Neurological:     Mental Status: He is alert.  Psychiatric:        Mood and Affect: Mood normal.        Behavior: Behavior normal.        Thought Content: Thought content normal.        Judgment: Judgment normal.      Assessment & Plan     Problem List Items Addressed This Visit       Cardiovascular and Mediastinum   Primary hypertension    Chronic, variable control per recent ED visit Goal of <140/<90 Continues on Lisinopril 20 mg Repeat CBC, CMP, TSH      Relevant Medications   tadalafil (CIALIS) 5 MG tablet   sildenafil (VIAGRA) 100 MG tablet   Other Relevant Orders   CBC with  Differential/Platelet   Comprehensive Metabolic Panel (CMET)   TSH   Vitamin D (25 hydroxy)     Other   Elevated PSA    PSA in 2023 elevated for age; goal <2.5 Repeat labs Discussed LUTS and concern for possible BPH Cialis would benefit both BPH and c/f ED; however, will require a PA       Relevant Orders   PSA   Erectile dysfunction - Primary    "Works a little bit to obtain" Unable to ejaculate; erection will fail prior to completion Not a full erection during masturbation but able to complete ejaculation Low or no morning erection 1+ years problem; worsening  Full lab panel with test; encouraged morning lab draw prior to 11      Relevant Orders   CBC with Differential/Platelet   Comprehensive Metabolic Panel (CMET)   TSH   Lipid panel   Hemoglobin A1c   Testosterone,Free and Total   Vitamin D (25 hydroxy)   PSA   Hypercholesterolemia    Chronic, controlled with lipitor 20 mg Repeat LP recommend diet low in saturated fat and regular exercise - 30 min at least 5 times per week       Relevant Medications   tadalafil (CIALIS) 5 MG tablet   sildenafil (VIAGRA) 100 MG tablet   Other Relevant Orders   Lipid panel  Hyperglycemia    Chronic, without pre-DM or DM previously Repeat A1c and CMP      Relevant Orders   Hemoglobin A1c   Vitamin D (25 hydroxy)   Second hand smoke exposure    Chronic; no respiratory or other circulatory concerns beyond ED; continue to monitor       Relevant Orders   CBC with Differential/Platelet   Comprehensive Metabolic Panel (CMET)   TSH   Lipid panel   Hemoglobin A1c   Return if symptoms worsen or fail to improve.    I discussed the assessment and treatment plan with the patient. The patient was provided an opportunity to ask questions and all were answered. The patient agreed with the plan and demonstrated an understanding of the instructions.   The patient was advised to call back or seek an in-person evaluation if the  symptoms worsen or if the condition fails to improve as anticipated.  I provided 10 minutes of face-to-face time during this encounter discussing ED and possible lab correlation, chronic concerns, and need for labs as well as treatment options for ED vs low T.   I, Jacky Kindle, FNP, have reviewed all documentation for this visit. The documentation on 03/24/23 for the exam, diagnosis, procedures, and orders are all accurate and complete.  Jacky Kindle, FNP [facility] [phone] (phone) [phone] (fax)  [facility]

## 2023-03-24 NOTE — Assessment & Plan Note (Signed)
Chronic, without pre-DM or DM previously Repeat A1c and CMP

## 2023-03-24 NOTE — Assessment & Plan Note (Signed)
Chronic, controlled with lipitor 20 mg Repeat LP recommend diet low in saturated fat and regular exercise - 30 min at least 5 times per week

## 2023-03-24 NOTE — Assessment & Plan Note (Signed)
Chronic, variable control per recent ED visit Goal of <140/<90 Continues on Lisinopril 20 mg Repeat CBC, CMP, TSH

## 2023-03-24 NOTE — Assessment & Plan Note (Signed)
"  Works a little bit to obtain" Unable to ejaculate; erection will fail prior to completion Not a full erection during masturbation but able to complete ejaculation Low or no morning erection 1+ years problem; worsening  Full lab panel with test; encouraged morning lab draw prior to 11

## 2023-03-24 NOTE — Assessment & Plan Note (Signed)
PSA in 2023 elevated for age; goal <2.5 Repeat labs Discussed LUTS and concern for possible BPH Cialis would benefit both BPH and c/f ED; however, will require a PA

## 2023-03-24 NOTE — Assessment & Plan Note (Signed)
Chronic; no respiratory or other circulatory concerns beyond ED; continue to monitor

## 2023-04-05 DIAGNOSIS — R972 Elevated prostate specific antigen [PSA]: Secondary | ICD-10-CM | POA: Diagnosis not present

## 2023-04-05 DIAGNOSIS — E7800 Pure hypercholesterolemia, unspecified: Secondary | ICD-10-CM | POA: Diagnosis not present

## 2023-04-05 DIAGNOSIS — Z7722 Contact with and (suspected) exposure to environmental tobacco smoke (acute) (chronic): Secondary | ICD-10-CM | POA: Diagnosis not present

## 2023-04-05 DIAGNOSIS — I10 Essential (primary) hypertension: Secondary | ICD-10-CM | POA: Diagnosis not present

## 2023-04-05 DIAGNOSIS — N529 Male erectile dysfunction, unspecified: Secondary | ICD-10-CM | POA: Diagnosis not present

## 2023-04-05 DIAGNOSIS — R739 Hyperglycemia, unspecified: Secondary | ICD-10-CM | POA: Diagnosis not present

## 2023-04-06 ENCOUNTER — Other Ambulatory Visit: Payer: Self-pay | Admitting: Family Medicine

## 2023-04-06 DIAGNOSIS — N529 Male erectile dysfunction, unspecified: Secondary | ICD-10-CM

## 2023-04-06 DIAGNOSIS — E559 Vitamin D deficiency, unspecified: Secondary | ICD-10-CM

## 2023-04-06 DIAGNOSIS — I2510 Atherosclerotic heart disease of native coronary artery without angina pectoris: Secondary | ICD-10-CM

## 2023-04-06 DIAGNOSIS — E7800 Pure hypercholesterolemia, unspecified: Secondary | ICD-10-CM

## 2023-04-06 DIAGNOSIS — R972 Elevated prostate specific antigen [PSA]: Secondary | ICD-10-CM

## 2023-04-06 MED ORDER — ROSUVASTATIN CALCIUM 20 MG PO TABS
20.0000 mg | ORAL_TABLET | Freq: Every day | ORAL | 3 refills | Status: DC
Start: 2023-04-06 — End: 2024-02-05

## 2023-04-06 MED ORDER — VITAMIN D3 125 MCG (5000 UT) PO CAPS
5000.0000 [IU] | ORAL_CAPSULE | Freq: Every day | ORAL | Status: AC
Start: 2023-04-06 — End: ?

## 2023-04-06 NOTE — Progress Notes (Signed)
The 10-year ASCVD risk score (Arnett DK, et al., 2019) is: 7.7% Cholesterol is elevated; both fats and bad/LDL cholesterol. Recommend statin increase to reduce risk of heart attack and stroke. Start crestor 20 mg daily; stop lipitor 20. I continue to recommend diet low in saturated fat and regular exercise - 30 min at least 5 times per week  Recommend daily Vit D 3 OTC at 5000 IU daily; repeat Vit D labs in 6 months.  Recommend consult with urology for elevated PSA.  Borderline prediabetes. Continue to recommend balanced, lower carb meals. Smaller meal size, adding snacks. Choosing water as drink of choice and increasing purposeful exercise.

## 2023-04-08 LAB — CBC WITH DIFFERENTIAL/PLATELET
Basophils Absolute: 0.1 10*3/uL (ref 0.0–0.2)
Basos: 1 %
EOS (ABSOLUTE): 0.2 10*3/uL (ref 0.0–0.4)
Eos: 3 %
Hematocrit: 47 % (ref 37.5–51.0)
Hemoglobin: 15.6 g/dL (ref 13.0–17.7)
Immature Grans (Abs): 0 10*3/uL (ref 0.0–0.1)
Immature Granulocytes: 0 %
Lymphocytes Absolute: 2.1 10*3/uL (ref 0.7–3.1)
Lymphs: 34 %
MCH: 29.2 pg (ref 26.6–33.0)
MCHC: 33.2 g/dL (ref 31.5–35.7)
MCV: 88 fL (ref 79–97)
Monocytes Absolute: 0.5 10*3/uL (ref 0.1–0.9)
Monocytes: 8 %
Neutrophils Absolute: 3.3 10*3/uL (ref 1.4–7.0)
Neutrophils: 54 %
Platelets: 211 10*3/uL (ref 150–450)
RBC: 5.34 x10E6/uL (ref 4.14–5.80)
RDW: 12.9 % (ref 11.6–15.4)
WBC: 6.1 10*3/uL (ref 3.4–10.8)

## 2023-04-08 LAB — COMPREHENSIVE METABOLIC PANEL
ALT: 41 IU/L (ref 0–44)
AST: 27 IU/L (ref 0–40)
Albumin: 4.7 g/dL (ref 3.8–4.9)
Alkaline Phosphatase: 69 IU/L (ref 44–121)
BUN/Creatinine Ratio: 13 (ref 9–20)
BUN: 14 mg/dL (ref 6–24)
Bilirubin Total: 1 mg/dL (ref 0.0–1.2)
CO2: 25 mmol/L (ref 20–29)
Calcium: 9.9 mg/dL (ref 8.7–10.2)
Chloride: 101 mmol/L (ref 96–106)
Creatinine, Ser: 1.11 mg/dL (ref 0.76–1.27)
Globulin, Total: 2.8 g/dL (ref 1.5–4.5)
Glucose: 88 mg/dL (ref 70–99)
Potassium: 4.6 mmol/L (ref 3.5–5.2)
Sodium: 140 mmol/L (ref 134–144)
Total Protein: 7.5 g/dL (ref 6.0–8.5)
eGFR: 80 mL/min/{1.73_m2} (ref >59)

## 2023-04-08 LAB — LIPID PANEL
Chol/HDL Ratio: 5.3 ratio — ABNORMAL HIGH (ref 0.0–5.0)
Cholesterol, Total: 170 mg/dL (ref 100–199)
HDL: 32 mg/dL — ABNORMAL LOW (ref >39)
LDL Chol Calc (NIH): 107 mg/dL — ABNORMAL HIGH (ref 0–99)
Triglycerides: 178 mg/dL — ABNORMAL HIGH (ref 0–149)
VLDL Cholesterol Cal: 31 mg/dL (ref 5–40)

## 2023-04-08 LAB — HEMOGLOBIN A1C
Est. average glucose Bld gHb Est-mCnc: 114 mg/dL
Hgb A1c MFr Bld: 5.6 % (ref 4.8–5.6)

## 2023-04-08 LAB — TESTOSTERONE,FREE AND TOTAL
Testosterone, Free: 15.6 pg/mL (ref 7.2–24.0)
Testosterone: 575 ng/dL (ref 264–916)

## 2023-04-08 LAB — TSH: TSH: 3.56 u[IU]/mL (ref 0.450–4.500)

## 2023-04-08 LAB — VITAMIN D 25 HYDROXY (VIT D DEFICIENCY, FRACTURES): Vit D, 25-Hydroxy: 28 ng/mL — ABNORMAL LOW (ref 30.0–100.0)

## 2023-04-08 LAB — PSA: Prostate Specific Ag, Serum: 3 ng/mL (ref 0.0–4.0)

## 2023-04-19 ENCOUNTER — Encounter: Payer: Self-pay | Admitting: Urology

## 2023-04-19 ENCOUNTER — Ambulatory Visit: Payer: BC Managed Care – PPO | Admitting: Urology

## 2023-04-19 VITALS — BP 146/82 | HR 75 | Ht 67.0 in | Wt 190.4 lb

## 2023-04-19 DIAGNOSIS — Z125 Encounter for screening for malignant neoplasm of prostate: Secondary | ICD-10-CM | POA: Diagnosis not present

## 2023-04-19 DIAGNOSIS — N529 Male erectile dysfunction, unspecified: Secondary | ICD-10-CM

## 2023-04-19 NOTE — Progress Notes (Signed)
   04/19/23 1:53 PM   Samuel Tyler. Aug 28, 1970 [id number]  CC: PSA screening, ED  HPI: 52 year old male referred for mild ED with problems about 10% of the time, as well as a normal PSA of 3.0.  PSA really has been stable over the last few years including 2.2 in 2021, 2.8 in 2023, and most recently 3.0 in September 2024.  He denies any family history of prostate cancer.  Reportedly has a family history of breast cancer in his mother, but BRCA testing was negative.  Testosterone was normal at 575 from September 2024.  He was trialed on both Viagra and Cialis, and has had significant improvement in erections on both those medications.  He prefers the Cialis.  He had questions about how to take this medication.  He denies any urinary symptoms.   PMH: Past Medical History:  Diagnosis Date   Anxiety    Hyperlipidemia     Surgical History: Past Surgical History:  Procedure Laterality Date   HERNIA REPAIR     WISDOM TOOTH EXTRACTION       Family History: Family History  Problem Relation Age of Onset   Hypertension Mother     Social History:  reports that he has never smoked. He has been exposed to tobacco smoke. He has never used smokeless tobacco. He reports current alcohol use. He reports that he does not use drugs.  Physical Exam: BP (!) 146/82 (BP Location: Left Arm, Patient Position: Sitting, Cuff Size: Normal)   Pulse 75   Ht 5\' 7"  (1.702 m)   Wt 190 lb 6.4 oz (86.4 kg)   BMI 29.82 kg/m    Constitutional:  Alert and oriented, No acute distress. Cardiovascular: No clubbing, cyanosis, or edema. Respiratory: Normal respiratory effort, no increased work of breathing. GI: Abdomen is soft, nontender, nondistended, no abdominal masses   Assessment & Plan:   52 year old male with mild ED improved on Cialis.  We discussed that could be taken as needed, daily, or 3 times a week per patient preference.  Risk and benefits were discussed.  Regarding the PSA, we reviewed the  AUA guidelines regarding the risks and benefits of screening, and that a normal PSA value is less than 4.  His PSA value is also been stable over the last few years.  We discussed the importance of continuing to monitor the PSA, and if that the PSA increased to above for a PSA reflex to free may be helpful, and could consider prostate biopsy or prostate MRI in the future if he had 2 values >4.  He will continue PSA screening through PCP  Continue Cialis Continue PSA screening yearly with PCP  Legrand Rams, MD 04/19/2023  [facility] [address], [address] [place], [place] [address] [phone]

## 2023-04-19 NOTE — Patient Instructions (Signed)

## 2023-06-26 DIAGNOSIS — R300 Dysuria: Secondary | ICD-10-CM | POA: Diagnosis not present

## 2023-07-20 DIAGNOSIS — N410 Acute prostatitis: Secondary | ICD-10-CM | POA: Diagnosis not present

## 2023-11-10 DIAGNOSIS — Z7689 Persons encountering health services in other specified circumstances: Secondary | ICD-10-CM | POA: Diagnosis not present

## 2023-11-10 DIAGNOSIS — Z87438 Personal history of other diseases of male genital organs: Secondary | ICD-10-CM | POA: Diagnosis not present

## 2023-11-23 ENCOUNTER — Ambulatory Visit: Admitting: Family Medicine

## 2024-02-05 ENCOUNTER — Other Ambulatory Visit: Payer: Self-pay

## 2024-02-05 ENCOUNTER — Telehealth: Payer: Self-pay | Admitting: Family Medicine

## 2024-02-05 ENCOUNTER — Other Ambulatory Visit: Payer: Self-pay | Admitting: Family Medicine

## 2024-02-05 DIAGNOSIS — I2510 Atherosclerotic heart disease of native coronary artery without angina pectoris: Secondary | ICD-10-CM

## 2024-02-05 DIAGNOSIS — I10 Essential (primary) hypertension: Secondary | ICD-10-CM

## 2024-02-05 DIAGNOSIS — E7800 Pure hypercholesterolemia, unspecified: Secondary | ICD-10-CM

## 2024-02-05 MED ORDER — ROSUVASTATIN CALCIUM 20 MG PO TABS: 20.0000 mg | ORAL_TABLET | Freq: Every day | ORAL | 0 refills | Status: DC

## 2024-02-05 NOTE — Telephone Encounter (Signed)
 Courtesy refill sent, message has been sent to pt to call to schedule an appt.

## 2024-02-05 NOTE — Telephone Encounter (Signed)
 [facility] faxed refill request for the following medications:   rosuvastatin (CRESTOR) 20 MG tablet     Please advise

## 2024-02-28 ENCOUNTER — Other Ambulatory Visit: Payer: Self-pay | Admitting: Family Medicine

## 2024-02-28 DIAGNOSIS — I10 Essential (primary) hypertension: Secondary | ICD-10-CM

## 2024-03-22 ENCOUNTER — Encounter: Payer: Self-pay | Admitting: Family Medicine

## 2024-03-22 ENCOUNTER — Ambulatory Visit (INDEPENDENT_AMBULATORY_CARE_PROVIDER_SITE_OTHER): Admitting: Family Medicine

## 2024-03-22 VITALS — BP 130/79 | HR 71 | Resp 16 | Ht 67.0 in | Wt 195.0 lb

## 2024-03-22 DIAGNOSIS — I10 Essential (primary) hypertension: Secondary | ICD-10-CM | POA: Diagnosis not present

## 2024-03-22 DIAGNOSIS — R972 Elevated prostate specific antigen [PSA]: Secondary | ICD-10-CM | POA: Diagnosis not present

## 2024-03-22 DIAGNOSIS — D2272 Melanocytic nevi of left lower limb, including hip: Secondary | ICD-10-CM | POA: Diagnosis not present

## 2024-03-22 DIAGNOSIS — E7800 Pure hypercholesterolemia, unspecified: Secondary | ICD-10-CM

## 2024-03-22 DIAGNOSIS — Z6830 Body mass index (BMI) 30.0-30.9, adult: Secondary | ICD-10-CM | POA: Diagnosis not present

## 2024-03-22 DIAGNOSIS — E6609 Other obesity due to excess calories: Secondary | ICD-10-CM | POA: Diagnosis not present

## 2024-03-22 DIAGNOSIS — E559 Vitamin D deficiency, unspecified: Secondary | ICD-10-CM | POA: Diagnosis not present

## 2024-03-22 DIAGNOSIS — D2261 Melanocytic nevi of right upper limb, including shoulder: Secondary | ICD-10-CM | POA: Diagnosis not present

## 2024-03-22 DIAGNOSIS — D2262 Melanocytic nevi of left upper limb, including shoulder: Secondary | ICD-10-CM | POA: Diagnosis not present

## 2024-03-22 DIAGNOSIS — E66811 Other obesity due to excess calories: Secondary | ICD-10-CM

## 2024-03-22 DIAGNOSIS — N529 Male erectile dysfunction, unspecified: Secondary | ICD-10-CM

## 2024-03-22 DIAGNOSIS — D225 Melanocytic nevi of trunk: Secondary | ICD-10-CM | POA: Diagnosis not present

## 2024-03-22 MED ORDER — LISINOPRIL 20 MG PO TABS: 20.0000 mg | ORAL_TABLET | Freq: Every day | ORAL | 3 refills | Status: AC

## 2024-03-22 MED ORDER — ROSUVASTATIN CALCIUM 20 MG PO TABS: 20.0000 mg | ORAL_TABLET | Freq: Every day | ORAL | 3 refills | Status: AC

## 2024-03-22 MED ORDER — TADALAFIL 5 MG PO TABS: 5.0000 mg | ORAL_TABLET | Freq: Every day | ORAL | 11 refills | Status: DC

## 2024-03-22 NOTE — Progress Notes (Signed)
 New Patient Office Visit  Introduced to nurse practitioner role and practice setting.  All questions answered.  Discussed provider/patient relationship and expectations.   Subjective    Patient ID: Samuel Tyler., male    DOB: Dec 01, 1970  Age: 53 y.o. MRN: [id number]  CC:  Chief Complaint  Patient presents with   Transitions Of Care    TOC/ Est care/ Refills   HPI  Discussed the use of AI scribe software for clinical note transcription with the patient, who gave verbal consent to proceed.  History of Present Illness Samuel Tyler. is a 53 year old male who presents for follow-up on PSA levels and medication refills.  In December, he experienced symptoms that he thought were due to a bladder infection, and he was told by another provider that he had prostatitis. Initial PSA levels were elevated at 68.7. He was treated with antibiotics, including ciprofloxacin, which led to a decrease in PSA levels to 16 in January and further down to 5.5 by April. He has not had a PSA check since May.  He is currently taking lisinopril  20 mg once daily, rosuvastatin  20 mg, and tadalafil  daily. He previously tried Viagra  but discontinued it in favor of tadalafil . He is also taking a vitamin D  supplement.  There have been no changes in his surgical or family history over the past year. He has no history of elevated blood sugars or prediabetes, with a blood sugar level of 5.6 a year ago. His thyroid levels were slightly elevated a few years ago but have been normal in recent tests.  He enjoys playing disc golf and traveled extensively over the summer, visiting [place]  and participating in a disc golf tournament in [place] .  Outpatient Encounter Medications as of 03/22/2024  Medication Sig   Cholecalciferol (VITAMIN D3) 125 MCG (5000 UT) CAPS Take 1 capsule (5,000 Units total) by mouth daily.   lisinopril  (ZESTRIL ) 20 MG tablet Take 1 tablet (20 mg total) by mouth daily.   rosuvastatin  (CRESTOR )  20 MG tablet Take 1 tablet (20 mg total) by mouth daily. Patient needs to schedule an appointment. This is a courtesy refill.   tadalafil  (CIALIS ) 5 MG tablet Take 1 tablet (5 mg total) by mouth daily.   [DISCONTINUED] lisinopril  (ZESTRIL ) 20 MG tablet TAKE 1 TABLET BY MOUTH EVERY DAY   [DISCONTINUED] rosuvastatin  (CRESTOR ) 20 MG tablet Take 1 tablet (20 mg total) by mouth daily. Patient needs to schedule an appointment. This is a courtesy refill.   [DISCONTINUED] sildenafil  (VIAGRA ) 100 MG tablet Take 1 tablet (100 mg total) by mouth as needed for erectile dysfunction.   [DISCONTINUED] tadalafil  (CIALIS ) 5 MG tablet Take 1 tablet (5 mg total) by mouth daily.   No facility-administered encounter medications on file as of 03/22/2024.    Past Medical History:  Diagnosis Date   Anxiety    Hyperlipidemia     Past Surgical History:  Procedure Laterality Date   HERNIA REPAIR     WISDOM TOOTH EXTRACTION      Family History  Problem Relation Age of Onset   Hypertension Mother     Social History   Socioeconomic History   Marital status: Married    Spouse name: Not on file   Number of children: Not on file   Years of education: Not on file   Highest education level: Some college, no degree  Occupational History   Not on file  Tobacco Use   Smoking status: Never  Passive exposure: Yes   Smokeless tobacco: Never  Substance and Sexual Activity   Alcohol use: Yes   Drug use: No   Sexual activity: Yes    Partners: Female    Birth control/protection: None  Other Topics Concern   Not on file  Social History Narrative   Not on file   Social Drivers of Health   Financial Resource Strain: Low Risk  (03/19/2024)   Overall Financial Resource Strain (CARDIA)    Difficulty of Paying Living Expenses: Not hard at all  Food Insecurity: No Food Insecurity (03/19/2024)   Hunger Vital Sign    Worried About Running Out of Food in the Last Year: Never true    Ran Out of Food in the Last Year:  Never true  Transportation Needs: No Transportation Needs (03/19/2024)   PRAPARE - Administrator, Civil Service (Medical): No    Lack of Transportation (Non-Medical): No  Physical Activity: Insufficiently Active (03/19/2024)   Exercise Vital Sign    Days of Exercise per Week: 1 day    Minutes of Exercise per Session: 90 min  Stress: No Stress Concern Present (03/19/2024)   [employer] of Occupational Health - Occupational Stress Questionnaire    Feeling of Stress: Only a little  Social Connections: Moderately Integrated (03/19/2024)   Social Connection and Isolation Panel    Frequency of Communication with Friends and Family: More than three times a week    Frequency of Social Gatherings with Friends and Family: Twice a week    Attends Religious Services: Never    Database administrator or Organizations: Yes    Attends Engineer, structural: 1 to 4 times per year    Marital Status: Married  Catering manager Violence: Not on file    ROS      Objective    BP 130/79 (BP Location: Right Arm, Patient Position: Sitting, Cuff Size: Normal)   Pulse 71   Resp 16   Ht 5' 7 (1.702 m)   Wt 195 lb (88.5 kg)   SpO2 100%   BMI 30.54 kg/m   Physical Exam Constitutional:      General: He is not in acute distress.    Appearance: Normal appearance. He is not ill-appearing, toxic-appearing or diaphoretic.  HENT:     Head: Normocephalic.     Right Ear: Tympanic membrane, ear canal and external ear normal.     Left Ear: Tympanic membrane, ear canal and external ear normal.     Nose: Nose normal.     Mouth/Throat:     Mouth: Mucous membranes are moist.  Eyes:     Extraocular Movements: Extraocular movements intact.     Conjunctiva/sclera: Conjunctivae normal.     Pupils: Pupils are equal, round, and reactive to light.  Neck:     Vascular: No carotid bruit.  Cardiovascular:     Rate and Rhythm: Normal rate and regular rhythm.     Heart sounds: No murmur heard.     No friction rub. No gallop.  Pulmonary:     Effort: Pulmonary effort is normal. No respiratory distress.     Breath sounds: Normal breath sounds. No stridor. No wheezing, rhonchi or rales.  Chest:     Chest wall: No tenderness.  Musculoskeletal:     Right lower leg: No edema.     Left lower leg: No edema.  Lymphadenopathy:     Cervical: No cervical adenopathy.  Skin:    General: Skin is warm  and dry.     Capillary Refill: Capillary refill takes less than 2 seconds.  Neurological:     General: No focal deficit present.     Mental Status: He is alert and oriented to person, place, and time. Mental status is at baseline.     Cranial Nerves: No cranial nerve deficit.     Sensory: No sensory deficit.     Motor: No weakness.     Coordination: Coordination normal.     Gait: Gait normal.  Psychiatric:        Mood and Affect: Mood normal.        Behavior: Behavior normal.        Thought Content: Thought content normal.        Judgment: Judgment normal.         Assessment & Plan:  Assessment and Plan Assessment & Plan Essential hypertension Blood pressure is well-controlled with lisinopril  20 mg daily. - Refill lisinopril  20 mg prescription. - Follow up in 6 months to monitor blood pressure. - Check CMP  Pure hypercholesterolemia Cholesterol is well-managed with rosuvastatin  20 mg daily. - Refill rosuvastatin  20 mg prescription. - Check LP  Vitamin D  deficiency Vitamin D  levels to be rechecked to determine supplementation needs. - Order vitamin D  level test. - Adjust vitamin D  supplementation based on test results.  Elevated prostate specific antigen with history of prostatitis PSA levels have decreased from 68 to 5.5 with no current symptoms of prostatitis - last check was in April 2025 Prostatitis was in December 2024. - Order PSA test to monitor levels.  Erectile Dysfunction - continue Cialis  5mg  daily  BMI = 30.54 Continue to make conscious decisions for well  balanced diet smaller portions with increase protein, fruits, veggies, water as drink of choice, decrease starches, processed foods, and saturated fats. Increase weekly exercise - 150 minutes per week.  - check A1c, tsh, cmp, lipid, cbc     Avitaminosis D -     VITAMIN D  25 Hydroxy (Vit-D Deficiency, Fractures)  Primary hypertension -     Comprehensive metabolic panel with GFR -     Lisinopril ; Take 1 tablet (20 mg total) by mouth daily.  Dispense: 90 tablet; Refill: 3  Hypercholesterolemia -     Lipid panel -     Rosuvastatin  Calcium ; Take 1 tablet (20 mg total) by mouth daily. Patient needs to schedule an appointment. This is a courtesy refill.  Dispense: 90 tablet; Refill: 3  Elevated PSA -     PSA  Class 1 obesity due to excess calories without serious comorbidity with body mass index (BMI) of 30.0 to 30.9 in adult -     CBC -     Hemoglobin A1c -     TSH  Erectile dysfunction, unspecified erectile dysfunction type -     Tadalafil ; Take 1 tablet (5 mg total) by mouth daily.  Dispense: 30 tablet; Refill: 11    Return in about 6 months (around 09/19/2024).   I, Curtis DELENA Boom, FNP, have reviewed all documentation for this visit. The documentation on 03/22/24 for the exam, diagnosis, procedures, and orders are all accurate and complete.   Curtis DELENA Boom, FNP

## 2024-03-23 LAB — COMPREHENSIVE METABOLIC PANEL WITH GFR
ALT: 42 IU/L (ref 0–44)
AST: 26 IU/L (ref 0–40)
Albumin: 5.1 g/dL — ABNORMAL HIGH (ref 3.8–4.9)
Alkaline Phosphatase: 70 IU/L (ref 44–121)
BUN/Creatinine Ratio: 12 (ref 9–20)
BUN: 12 mg/dL (ref 6–24)
Bilirubin Total: 0.7 mg/dL (ref 0.0–1.2)
CO2: 26 mmol/L (ref 20–29)
Calcium: 10.4 mg/dL — ABNORMAL HIGH (ref 8.7–10.2)
Chloride: 100 mmol/L (ref 96–106)
Creatinine, Ser: 1.01 mg/dL (ref 0.76–1.27)
Globulin, Total: 2.8 g/dL (ref 1.5–4.5)
Glucose: 86 mg/dL (ref 70–99)
Potassium: 4.2 mmol/L (ref 3.5–5.2)
Sodium: 141 mmol/L (ref 134–144)
Total Protein: 7.9 g/dL (ref 6.0–8.5)
eGFR: 89 mL/min/1.73 (ref >59)

## 2024-03-23 LAB — CBC
Hematocrit: 50.4 % (ref 37.5–51.0)
Hemoglobin: 16.2 g/dL (ref 13.0–17.7)
MCH: 28.8 pg (ref 26.6–33.0)
MCHC: 32.1 g/dL (ref 31.5–35.7)
MCV: 90 fL (ref 79–97)
Platelets: 218 x10E3/uL (ref 150–450)
RBC: 5.63 x10E6/uL (ref 4.14–5.80)
RDW: 12.5 % (ref 11.6–15.4)
WBC: 7 x10E3/uL (ref 3.4–10.8)

## 2024-03-23 LAB — HEMOGLOBIN A1C
Est. average glucose Bld gHb Est-mCnc: 105 mg/dL
Hgb A1c MFr Bld: 5.3 % (ref 4.8–5.6)

## 2024-03-23 LAB — PSA: Prostate Specific Ag, Serum: 4.3 ng/mL — ABNORMAL HIGH (ref 0.0–4.0)

## 2024-03-23 LAB — VITAMIN D 25 HYDROXY (VIT D DEFICIENCY, FRACTURES): Vit D, 25-Hydroxy: 41.4 ng/mL (ref 30.0–100.0)

## 2024-03-23 LAB — LIPID PANEL
Chol/HDL Ratio: 4.6 ratio (ref 0.0–5.0)
Cholesterol, Total: 160 mg/dL (ref 100–199)
HDL: 35 mg/dL — ABNORMAL LOW (ref >39)
LDL Chol Calc (NIH): 92 mg/dL (ref 0–99)
Triglycerides: 189 mg/dL — ABNORMAL HIGH (ref 0–149)
VLDL Cholesterol Cal: 33 mg/dL (ref 5–40)

## 2024-03-23 LAB — TSH: TSH: 3.41 u[IU]/mL (ref 0.450–4.500)

## 2024-03-24 ENCOUNTER — Ambulatory Visit: Payer: Self-pay | Admitting: Family Medicine

## 2024-04-01 ENCOUNTER — Other Ambulatory Visit: Payer: Self-pay | Admitting: Family Medicine

## 2024-04-01 DIAGNOSIS — R972 Elevated prostate specific antigen [PSA]: Secondary | ICD-10-CM

## 2024-05-07 DIAGNOSIS — R972 Elevated prostate specific antigen [PSA]: Secondary | ICD-10-CM | POA: Diagnosis not present

## 2024-05-08 ENCOUNTER — Ambulatory Visit: Payer: Self-pay | Admitting: Family Medicine

## 2024-05-08 LAB — PSA: Prostate Specific Ag, Serum: 4.4 ng/mL — ABNORMAL HIGH (ref 0.0–4.0)

## 2024-07-08 ENCOUNTER — Other Ambulatory Visit: Payer: Self-pay | Admitting: Medical Genetics

## 2024-07-09 ENCOUNTER — Encounter: Payer: Self-pay | Admitting: Urology

## 2024-07-09 ENCOUNTER — Other Ambulatory Visit
Admission: RE | Admit: 2024-07-09 | Discharge: 2024-07-09 | Disposition: A | Discharge status: Home / Self Care | Payer: Self-pay | Source: Ambulatory Visit | Attending: Medical Genetics | Admitting: Medical Genetics

## 2024-07-09 ENCOUNTER — Ambulatory Visit: Admitting: Urology

## 2024-07-09 VITALS — BP 145/87 | HR 75 | Wt 195.0 lb

## 2024-07-09 DIAGNOSIS — N529 Male erectile dysfunction, unspecified: Secondary | ICD-10-CM | POA: Diagnosis not present

## 2024-07-09 DIAGNOSIS — R972 Elevated prostate specific antigen [PSA]: Secondary | ICD-10-CM | POA: Diagnosis not present

## 2024-07-09 MED ORDER — TADALAFIL 5 MG PO TABS: 5.0000 mg | ORAL_TABLET | ORAL | 3 refills | Status: AC

## 2024-07-09 NOTE — Patient Instructions (Addendum)
 Please call [phone] or [phone] to schedule your imaging prior to your appointment.   Your medication has been sent to [facility]. This is an therapist, occupational that offers medication at a significantly discounted price. You will need to go to the website ([email or web address]) to sign up for an account, give your address and enter your payment method.    Understanding PSA Screening  What is PSA Screening? PSA stands for Prostate-Specific Antigen. It is a protein made by the prostate gland.  PSA is made by normal prostate tissue, but can be elevated in patients with prostate cancer.  There are other factors that can cause an increased PSA besides prostate cancer including an enlarged prostate(BPH), recent ejaculation, infection(prostatitis), inflammation, recent illness or procedure.  A normal PSA level is less than 4 for men under age 3.  Why is PSA Screening Important? Prostate cancer is a common cancer in men, by finding prostate cancer early before patients have symptoms we can often cure this with either surgery or radiation.  Some prostate cancers grow very slowly and do not need any intervention and can be safely monitored(active surveillance).  Our goal is to find those more aggressive prostate cancers that if untreated would spread outside the prostate and cause symptoms or death.  If prostate cancer spreads outside the prostate, it cannot usually be cured, but multiple treatments are available to slow the growth of cancer and prolong life.  Who Should Get Tested? Most patients should start PSA testing around age 36 or 68, however high risk patients with a family history of prostate cancer, African American descent, patients with a strong family history of breast cancer should consider screening earlier starting at age 28. After age 43, the risks of screening start outweigh the benefits, and routine screening is not recommended after age 61.  This is because even if you  develop prostate cancer in your 42s, 33s, or 90s it tends to grow so slowly that it would not cause symptoms or problems.  What Are the Benefits?    Early detection of prostate cancer More treatment options if cancer is found  Options if you have an elevated PSA: First, a confirmatory second PSA level will always be checked to confirm elevation, as false elevations are common and we want to avoid any invasive testing if possible If you have 2 elevated PSA levels, options include:  PHI(prostate health index) score: (fancy PSA blood test), this can help determine your risk of prostate cancer if your PSA is mildly elevated between 4 and 10 before moving to more expensive/invasive testing Prostate MRI: Imaging test that can detect areas suspicious for prostate cancer, if MRI is completely normal can potentially avoid a prostate biopsy Prostate biopsy: 10 to 15-minute procedure performed in clinic, can be uncomfortable, 1 to 2% chance of serious bleeding or infection, best test to determine if prostate cancer is present but more invasive   What Are the Risks or Downsides?    Prostate biopsy can be uncomfortable with a small risk of bleeding or infection Some prostate cancers grow very slowly and might never cause problems, and detection may lead to unnecessary treatments Possible side effects from follow-up tests or treatments  Please contact Central Scheduling to set up your prostate MRI at [phone].  Prostate MRI Prep:  1- No ejaculation 48 hours prior to exam  2- No caffeine or carbonated beverages on day of the exam  3- Eat light diet evening prior and day of exam  4- Avoid  eating 4 hours prior to exam  5- Fleets enema needs to be done 4 hours prior to exam -See below. Can be purchased at the drug store.

## 2024-07-09 NOTE — Progress Notes (Signed)
" ° °  07/09/2024 8:09 AM   Alm DELENA Tanda Mickey. 1970/10/21 [id number]  Reason for visit: Elevated PSA, ED  History: Initial visit October 2024 for mild ED and PSA screening ED well-controlled on Cialis  every other day, testosterone  was normal PSA was normal at that visit, 3  Physical Exam: BP (!) 145/87   Pulse 75   Wt 195 lb (88.5 kg)   SpO2 97%   BMI 30.54 kg/m   Imaging/labs: PSA history reviewed in epic-PSA values of 4.4 and 4.3 in the last 3 months, increased from prior values of 3.0 2024, 2.8 2023, 2.2 2021, 2.0 2020  Today: PSA has now increased above 4, confirmed on repeat He actually had PSA up to 68 in December 2024 when he had prostatitis and was treated with antibiotics, those records are outside of our system PSA decreased within the next few months back down to 4  Plan:   Elevated PSA: We reviewed the implications of an elevated PSA and the uncertainty surrounding it. In general, a man's PSA increases with age and is produced by both normal and cancerous prostate tissue. The differential diagnosis for elevated PSA includes BPH, prostate cancer, infection/prostatitis, recent intercourse/ejaculation, recent urethroscopic manipulation (foley placement/cystoscopy) or trauma. Management of an elevated PSA can include observation/surveillance, prostate MRI, or prostate biopsy and we discussed this in detail. Our goal is to detect clinically significant prostate cancers, and manage with either active surveillance, surgery, or radiation for localized disease. Risks of prostate biopsy include bleeding, infection (including life threatening sepsis), pain, and lower urinary symptoms. Hematuria, hematospermia, and blood in the stool are all common after biopsy and can persist up to 4 weeks. ED: Cialis  refilled, sent to [email or web address] Prostate MRI, call with results    Redell JAYSON Burnet, MD  [facility] [address] [place], [place] [address] [phone]  "

## 2024-07-19 ENCOUNTER — Ambulatory Visit
Admission: RE | Admit: 2024-07-19 | Discharge: 2024-07-19 | Disposition: A | Discharge status: Home / Self Care | Source: Ambulatory Visit | Attending: Urology | Admitting: Urology

## 2024-07-19 DIAGNOSIS — R972 Elevated prostate specific antigen [PSA]: Secondary | ICD-10-CM | POA: Diagnosis present

## 2024-07-19 MED ORDER — GADOBUTROL 1 MMOL/ML IV SOLN
8.0000 mL | Freq: Once | INTRAVENOUS | Status: AC | PRN
  Administered 2024-07-19: 8 mL via INTRAVENOUS

## 2024-07-25 ENCOUNTER — Ambulatory Visit: Payer: Self-pay | Admitting: Urology

## 2024-07-25 DIAGNOSIS — R972 Elevated prostate specific antigen [PSA]: Secondary | ICD-10-CM

## 2024-07-25 NOTE — Telephone Encounter (Signed)
 Appt scheduled, PHI ordered.

## 2024-07-26 LAB — GENECONNECT MOLECULAR SCREEN: Genetic Analysis Overall Interpretation: NEGATIVE

## 2024-09-19 ENCOUNTER — Ambulatory Visit

## 2025-01-16 ENCOUNTER — Other Ambulatory Visit

## 2025-01-22 ENCOUNTER — Ambulatory Visit: Admitting: Urology
